# Patient Record
Sex: Female | Born: 1975 | Race: White | Hispanic: No | Marital: Single | Smoking: Current every day smoker
Health system: Southern US, Community
[De-identification: ages and names within clinical notes are randomized; demographics above are authoritative.]

## PROBLEM LIST (undated history)

## (undated) DIAGNOSIS — F32A Depression, unspecified: Secondary | ICD-10-CM

## (undated) DIAGNOSIS — J42 Unspecified chronic bronchitis: Secondary | ICD-10-CM

## (undated) DIAGNOSIS — M1990 Unspecified osteoarthritis, unspecified site: Secondary | ICD-10-CM

## (undated) DIAGNOSIS — T7840XA Allergy, unspecified, initial encounter: Secondary | ICD-10-CM

## (undated) DIAGNOSIS — K219 Gastro-esophageal reflux disease without esophagitis: Secondary | ICD-10-CM

## (undated) HISTORY — DX: Allergy, unspecified, initial encounter: T78.40XA

## (undated) HISTORY — DX: Unspecified osteoarthritis, unspecified site: M19.90

## (undated) HISTORY — PX: CARDIAC SURGERY: SHX584

## (undated) HISTORY — DX: Depression, unspecified: F32.A

## (undated) HISTORY — DX: Gastro-esophageal reflux disease without esophagitis: K21.9

## (undated) HISTORY — PX: CARPAL TUNNEL RELEASE: SHX101

---

## 2017-08-22 DIAGNOSIS — E539 Vitamin B deficiency, unspecified: Secondary | ICD-10-CM | POA: Insufficient documentation

## 2018-12-22 DIAGNOSIS — J209 Acute bronchitis, unspecified: Secondary | ICD-10-CM | POA: Insufficient documentation

## 2018-12-22 DIAGNOSIS — Z8249 Family history of ischemic heart disease and other diseases of the circulatory system: Secondary | ICD-10-CM | POA: Insufficient documentation

## 2018-12-22 DIAGNOSIS — R0789 Other chest pain: Secondary | ICD-10-CM | POA: Insufficient documentation

## 2019-07-01 ENCOUNTER — Emergency Department
Admission: EM | Admit: 2019-07-01 | Discharge: 2019-07-01 | Disposition: A | Discharge status: Home / Self Care | Payer: Self-pay | Attending: Emergency Medicine | Admitting: Emergency Medicine

## 2019-07-01 ENCOUNTER — Encounter: Payer: Self-pay | Admitting: Emergency Medicine

## 2019-07-01 ENCOUNTER — Emergency Department: Payer: Self-pay

## 2019-07-01 ENCOUNTER — Other Ambulatory Visit: Payer: Self-pay

## 2019-07-01 DIAGNOSIS — F17210 Nicotine dependence, cigarettes, uncomplicated: Secondary | ICD-10-CM | POA: Insufficient documentation

## 2019-07-01 DIAGNOSIS — J020 Streptococcal pharyngitis: Secondary | ICD-10-CM | POA: Insufficient documentation

## 2019-07-01 DIAGNOSIS — N3000 Acute cystitis without hematuria: Secondary | ICD-10-CM | POA: Insufficient documentation

## 2019-07-01 DIAGNOSIS — A388 Scarlet fever with other complications: Secondary | ICD-10-CM

## 2019-07-01 HISTORY — DX: Unspecified chronic bronchitis: J42

## 2019-07-01 LAB — CBC
HCT: 42 % (ref 36.0–46.0)
Hemoglobin: 15.5 g/dL — ABNORMAL HIGH (ref 12.0–15.0)
MCH: 36.9 pg — ABNORMAL HIGH (ref 26.0–34.0)
MCHC: 36.9 g/dL — ABNORMAL HIGH (ref 30.0–36.0)
MCV: 100 fL (ref 80.0–100.0)
Platelets: 219 10*3/uL (ref 150–400)
RBC: 4.2 MIL/uL (ref 3.87–5.11)
RDW: 12.9 % (ref 11.5–15.5)
WBC: 8.9 10*3/uL (ref 4.0–10.5)
nRBC: 0 % (ref 0.0–0.2)

## 2019-07-01 LAB — BASIC METABOLIC PANEL
Anion gap: 8 (ref 5–15)
BUN: 12 mg/dL (ref 6–20)
CO2: 23 mmol/L (ref 22–32)
Calcium: 9.4 mg/dL (ref 8.9–10.3)
Chloride: 106 mmol/L (ref 98–111)
Creatinine, Ser: 0.54 mg/dL (ref 0.44–1.00)
GFR calc Af Amer: >60 mL/min (ref >60)
GFR calc non Af Amer: >60 mL/min (ref >60)
Glucose, Bld: 103 mg/dL — ABNORMAL HIGH (ref 70–99)
Potassium: 3.9 mmol/L (ref 3.5–5.1)
Sodium: 137 mmol/L (ref 135–145)

## 2019-07-01 LAB — URINALYSIS, COMPLETE (UACMP) WITH MICROSCOPIC
Bacteria, UA: NONE SEEN
Bilirubin Urine: NEGATIVE
Glucose, UA: NEGATIVE mg/dL
Hgb urine dipstick: NEGATIVE
Ketones, ur: NEGATIVE mg/dL
Nitrite: NEGATIVE
Protein, ur: NEGATIVE mg/dL
Specific Gravity, Urine: 1.018 (ref 1.005–1.030)
pH: 7 (ref 5.0–8.0)

## 2019-07-01 LAB — POCT PREGNANCY, URINE: Preg Test, Ur: NEGATIVE

## 2019-07-01 LAB — TROPONIN I (HIGH SENSITIVITY): Troponin I (High Sensitivity): <2 ng/L (ref <18)

## 2019-07-01 MED ORDER — LIDOCAINE VISCOUS HCL 2 % MT SOLN
15.0000 mL | OROMUCOSAL | 0 refills | Status: DC | PRN
Start: 2019-07-01 — End: 2019-08-31

## 2019-07-01 MED ORDER — PREDNISONE 10 MG (21) PO TBPK
ORAL_TABLET | ORAL | 0 refills | Status: DC
Start: 2019-07-03 — End: 2019-08-31

## 2019-07-01 MED ORDER — DEXAMETHASONE SODIUM PHOSPHATE 10 MG/ML IJ SOLN
10.0000 mg | Freq: Once | INTRAMUSCULAR | Status: AC
  Administered 2019-07-01: 10 mg via INTRAMUSCULAR
  Filled 2019-07-01: qty 1

## 2019-07-01 MED ORDER — FLUCONAZOLE 150 MG PO TABS: 150.0000 mg | ORAL_TABLET | Freq: Every day | ORAL | 0 refills | Status: DC

## 2019-07-01 MED ORDER — CEPHALEXIN 500 MG PO CAPS
500.0000 mg | ORAL_CAPSULE | Freq: Two times a day (BID) | ORAL | 0 refills | Status: AC
Start: 2019-07-01 — End: 2019-07-11

## 2019-07-01 NOTE — Discharge Instructions (Signed)
Please follow-up with the primary care provider of your choice for symptoms that are not improving with medications.  Return to the emergency department for symptoms of change or worsen if you are unable to schedule an appointment.

## 2019-07-01 NOTE — ED Notes (Signed)
Pt states she feels the rash is getting worse. Pt denies any CP at this time. Pt states it is more of back pain from the rash. Pt states it hurts to swallow.  Pt denies any trouble breathing or SOB. Pt talking in complete sentences.

## 2019-07-01 NOTE — ED Triage Notes (Addendum)
Patient to ER for c/o rash that began 2 days ago. States she has developed a tightness feeling in her chest today. Rash is red, raised, and itchy. Patient states she also has a couple bruises present since rashes started (one on inner right thigh and one on inner right arm/forearm). Patient denies any recent known illnesses. +HA. Reports decreased urination with dark urine production.

## 2019-07-01 NOTE — ED Notes (Signed)
See triage note states she developed some itching couple of days ago  Then developed fine rash to abd,back and legs  Also states she feel like it is in her mouth

## 2019-07-02 NOTE — ED Provider Notes (Signed)
[facility] Emergency Department Provider Note ____________________________________________   First MD Initiated Contact with Patient 07/01/19 1715     (approximate)  I have reviewed the triage vital signs and the nursing notes.   HISTORY  Chief Complaint Rash and Chest Pain  HPI Katelyn Haynes is a 44 y.o. female presenting to the emergency department for treatment and evaluation of rash that began a couple of days ago.  Rash is on her abdomen, chest, lower extremities.  She is also complaining of sore throat and states that it "feels like strep throat."  She has had a headache as well as some chest "tightness."  She has used multiple over-the-counter creams to try and control the rash.  She reports symptoms have worsened and she became concerned when her sore throat got worse.         Past Medical History:  Diagnosis Date  . Chronic bronchitis (HCC)     There are no problems to display for this patient.   Past Surgical History:  Procedure Laterality Date  . CARPAL TUNNEL RELEASE Right   . CESAREAN SECTION     x4    Prior to Admission medications   Medication Sig Start Date End Date Taking? Authorizing Provider  cephALEXin (KEFLEX) 500 MG capsule Take 1 capsule (500 mg total) by mouth 2 (two) times daily for 10 days. 07/01/19 07/11/19  Siobahn Worsley, Rulon Eisenmenger B, FNP  fluconazole (DIFLUCAN) 150 MG tablet Take 1 tablet (150 mg total) by mouth daily. 07/01/19   Olliver Boyadjian B, FNP  lidocaine (XYLOCAINE) 2 % solution Use as directed 15 mLs in the mouth or throat as needed for mouth pain. 07/01/19   Jamaica Inthavong B, FNP  predniSONE (STERAPRED UNI-PAK 21 TAB) 10 MG (21) TBPK tablet Take 6 tablets on the first day and decrease by 1 tablet each day until finished. 07/03/19   Tomoki Lucken, Rulon Eisenmenger B, FNP    Allergies Bactrim [sulfamethoxazole-trimethoprim], Bee venom, Compazine [prochlorperazine], Contrast media [iodinated diagnostic agents], and Seroquel [quetiapine]  No  family history on file.  Social History Social History   Tobacco Use  . Smoking status: Current Every Day Smoker  . Smokeless tobacco: Never Used  Substance Use Topics  . Alcohol use: Yes  . Drug use: Not on file    Review of Systems  Constitutional: No fever/chills Eyes: No visual changes. ENT: Positive for sore throat. Cardiovascular: Denies chest pain. Respiratory: Denies shortness of breath. Gastrointestinal: No abdominal pain.  No nausea, no vomiting.  No diarrhea.  No constipation. Genitourinary: Negative for dysuria.  Positive for darker than normal urine Musculoskeletal: Negative for back pain. Skin: Positive for rash. Neurological: Negative for headaches, focal weakness or numbness. ____________________________________________   PHYSICAL EXAM:  VITAL SIGNS: ED Triage Vitals  Enc Vitals Group     BP 07/01/19 1442 (!) 122/50     Pulse Rate 07/01/19 1442 89     Resp 07/01/19 1442 20     Temp 07/01/19 1442 98 F (36.7 C)     Temp Source 07/01/19 1442 Oral     SpO2 07/01/19 1442 97 %     Weight 07/01/19 1447 186 lb (84.4 kg)     Height 07/01/19 1447 5' (1.524 m)     Head Circumference --      Peak Flow --      Pain Score 07/01/19 1443 7     Pain Loc --      Pain Edu? --      Excl.  in GC? --     Constitutional: Alert and oriented. Well appearing and in no acute distress. Eyes: Conjunctivae are normal. PERRL. EOMI. Head: Atraumatic. Nose: No congestion/rhinnorhea. Mouth/Throat: Mucous membranes are moist.  Oropharynx erythematous with enlarged tonsils with exudate. Neck: No stridor.   Hematological/Lymphatic/Immunilogical: No cervical lymphadenopathy. Cardiovascular: Normal rate, regular rhythm. Grossly normal heart sounds.  Good peripheral circulation. Respiratory: Normal respiratory effort.  No retractions. Lungs CTAB. Gastrointestinal: Soft and nontender. No distention. No abdominal bruits. No CVA tenderness. Genitourinary:  Musculoskeletal: No lower  extremity tenderness nor edema.  No joint effusions. Neurologic:  Normal speech and language. No gross focal neurologic deficits are appreciated. No gait instability. Skin:  Skin is warm, dry and intact.  Scarlatiniform type rash noted over abdomen and chest. Psychiatric: Mood and affect are normal. Speech and behavior are normal.  ____________________________________________   LABS (all labs ordered are listed, but only abnormal results are displayed)  Labs Reviewed  BASIC METABOLIC PANEL - Abnormal; Notable for the following components:      Result Value   Glucose, Bld 103 (*)    All other components within normal limits  CBC - Abnormal; Notable for the following components:   Hemoglobin 15.5 (*)    MCH 36.9 (*)    MCHC 36.9 (*)    All other components within normal limits  URINALYSIS, COMPLETE (UACMP) WITH MICROSCOPIC - Abnormal; Notable for the following components:   Color, Urine YELLOW (*)    APPearance HAZY (*)    Leukocytes,Ua MODERATE (*)    All other components within normal limits  POC URINE PREG, ED  POCT PREGNANCY, URINE  TROPONIN I (HIGH SENSITIVITY)  TROPONIN I (HIGH SENSITIVITY)   ____________________________________________  EKG  Not indicated ____________________________________________  RADIOLOGY  ED MD interpretation:     Official radiology report(s): DG Chest 2 View  Result Date: 07/01/2019 CLINICAL DATA:  Chest tightness EXAM: CHEST - 2 VIEW COMPARISON:  07/17/2018 FINDINGS: The heart size and mediastinal contours are within normal limits. Both lungs are clear. The visualized skeletal structures are unremarkable. IMPRESSION: No active cardiopulmonary disease. Electronically Signed   By: Jasmine Pang M.D.   On: 07/01/2019 15:20    ____________________________________________   PROCEDURES  Procedure(s) performed (including Critical Care):  Procedures  ____________________________________________   INITIAL IMPRESSION / ASSESSMENT AND PLAN      44 year old female presenting to emergency department for treatment and evaluation of rash, sore throat, chest tightness, and darker than normal urine with mild dysuria.  See HPI for further details.  DIFFERENTIAL DIAGNOSIS  Strep pharyngitis with scarlatiniform rash, glomerulonephritis, acute cystitis, contact dermatitis, allergic reaction  ED COURSE  Rashes consistent with a scarlatiniform secondary to strep throat.  Urinalysis is consistent with acute cystitis.  She will be treated with 10 days of Keflex, viscous lidocaine for the sore throat, and prednisone.  Patient requested injection of steroid while here in the emergency department.  She was advised not to take the prednisone taper unless symptoms are not improving.  She is to wait 2 days before starting the Pred pack.  She is to follow-up with a primary care provider for choice for symptoms that are not improving over the next couple days.  She is to return to the emergency department for any symptom that changes or worsens if she is unable to schedule appointment. ____________________________________________   FINAL CLINICAL IMPRESSION(S) / ED DIAGNOSES  Final diagnoses:  Streptococcal sore throat with scarlatina  Acute cystitis without hematuria     ED Discharge  Orders         Ordered    predniSONE (STERAPRED UNI-PAK 21 TAB) 10 MG (21) TBPK tablet     07/01/19 1816    cephALEXin (KEFLEX) 500 MG capsule  2 times daily     07/01/19 1816    fluconazole (DIFLUCAN) 150 MG tablet  Daily     07/01/19 1816    lidocaine (XYLOCAINE) 2 % solution  As needed     07/01/19 1816           Katelyn Haynes was evaluated in Emergency Department on 07/02/2019 for the symptoms described in the history of present illness. She was evaluated in the context of the global COVID-19 pandemic, which necessitated consideration that the patient might be at risk for infection with the SARS-CoV-2 virus that causes COVID-19. Institutional protocols  and algorithms that pertain to the evaluation of patients at risk for COVID-19 are in a state of rapid change based on information released by regulatory bodies including the CDC and federal and state organizations. These policies and algorithms were followed during the patient's care in the ED.   Note:  This document was prepared using Dragon voice recognition software and may include unintentional dictation errors.   Victorino Dike, FNP 07/02/19 0008    Earleen Newport, MD 07/02/19 1459

## 2019-08-31 ENCOUNTER — Encounter: Payer: Self-pay | Admitting: Emergency Medicine

## 2019-08-31 ENCOUNTER — Emergency Department
Admission: EM | Admit: 2019-08-31 | Discharge: 2019-08-31 | Disposition: A | Discharge status: Home / Self Care | Payer: Medicaid Other | Attending: Emergency Medicine | Admitting: Emergency Medicine

## 2019-08-31 ENCOUNTER — Other Ambulatory Visit: Payer: Self-pay

## 2019-08-31 ENCOUNTER — Emergency Department: Payer: Medicaid Other

## 2019-08-31 DIAGNOSIS — Z91030 Bee allergy status: Secondary | ICD-10-CM | POA: Insufficient documentation

## 2019-08-31 DIAGNOSIS — J42 Unspecified chronic bronchitis: Secondary | ICD-10-CM | POA: Insufficient documentation

## 2019-08-31 DIAGNOSIS — M5412 Radiculopathy, cervical region: Secondary | ICD-10-CM | POA: Insufficient documentation

## 2019-08-31 DIAGNOSIS — Z79899 Other long term (current) drug therapy: Secondary | ICD-10-CM | POA: Insufficient documentation

## 2019-08-31 DIAGNOSIS — F17200 Nicotine dependence, unspecified, uncomplicated: Secondary | ICD-10-CM | POA: Insufficient documentation

## 2019-08-31 MED ORDER — PREDNISONE 20 MG PO TABS
60.0000 mg | ORAL_TABLET | Freq: Once | ORAL | Status: AC
  Administered 2019-08-31: 60 mg via ORAL
  Filled 2019-08-31: qty 3

## 2019-08-31 MED ORDER — TRAMADOL HCL 50 MG PO TABS: 50.0000 mg | ORAL_TABLET | Freq: Four times a day (QID) | ORAL | 0 refills | Status: DC | PRN

## 2019-08-31 MED ORDER — CYCLOBENZAPRINE HCL 10 MG PO TABS: 10.0000 mg | ORAL_TABLET | Freq: Three times a day (TID) | ORAL | 0 refills | Status: DC | PRN

## 2019-08-31 MED ORDER — METHYLPREDNISOLONE 4 MG PO TBPK
ORAL_TABLET | ORAL | 0 refills | Status: DC
Start: 2019-08-31 — End: 2021-12-12

## 2019-08-31 MED ORDER — TRAMADOL HCL 50 MG PO TABS
50.0000 mg | ORAL_TABLET | Freq: Once | ORAL | Status: AC
  Administered 2019-08-31: 50 mg via ORAL
  Filled 2019-08-31: qty 1

## 2019-08-31 MED ORDER — CYCLOBENZAPRINE HCL 10 MG PO TABS
10.0000 mg | ORAL_TABLET | Freq: Once | ORAL | Status: AC
  Administered 2019-08-31: 10 mg via ORAL
  Filled 2019-08-31: qty 1

## 2019-08-31 NOTE — ED Triage Notes (Signed)
Pt reports pain to her right shoulder that started yesterday. Pt states the pain shoots down to her elbow. Pt denies obvious injuries.

## 2019-08-31 NOTE — ED Notes (Signed)
See triage note  Presents with pain to right shoulder   States pain is from neck into posterior shoulder  Denies any injury   Pain increased with movement

## 2019-08-31 NOTE — ED Provider Notes (Signed)
[facility] Emergency Department Provider Note   ____________________________________________   First MD Initiated Contact with Patient 08/31/19 1042     (approximate)  I have reviewed the triage vital signs and the nursing notes.   HISTORY  Chief Complaint Shoulder Pain    HPI Katelyn Haynes is a 44 y.o. female patient presents with radicular pain to the right upper extremity from her neck.  Onset of complaint yesterday.  No provocative incident for complaint.  Patient rates pain radiates all the way to her index finger.      Past Medical History:  Diagnosis Date  . Chronic bronchitis (HCC)     There are no problems to display for this patient.   Past Surgical History:  Procedure Laterality Date  . CARPAL TUNNEL RELEASE Right   . CESAREAN SECTION     x4    Prior to Admission medications   Medication Sig Start Date End Date Taking? Authorizing Provider  atorvastatin (LIPITOR) 10 MG tablet Take 10 mg by mouth daily.   Yes [provider]  citalopram (CELEXA) 20 MG tablet Take 20 mg by mouth daily.   Yes [provider]  isosorbide dinitrate (ISORDIL) 10 MG tablet Take 10 mg by mouth 2 (two) times daily.   Yes [provider]  cyclobenzaprine (FLEXERIL) 10 MG tablet Take 1 tablet (10 mg total) by mouth 3 (three) times daily as needed. 08/31/19   Joni Reining, PA-C  methylPREDNISolone (MEDROL DOSEPAK) 4 MG TBPK tablet Take Tapered dose as directed 08/31/19   Joni Reining, PA-C  traMADol (ULTRAM) 50 MG tablet Take 1 tablet (50 mg total) by mouth every 6 (six) hours as needed for moderate pain. 08/31/19   Joni Reining, PA-C    Allergies Bactrim [sulfamethoxazole-trimethoprim], Bee venom, Compazine [prochlorperazine], Contrast media [iodinated diagnostic agents], and Seroquel [quetiapine]  No family history on file.  Social History Social History   Tobacco Use  . Smoking status: Current Every Day Smoker  .  Smokeless tobacco: Never Used  Substance Use Topics  . Alcohol use: Yes  . Drug use: Not on file    Review of Systems Constitutional: No fever/chills Eyes: No visual changes. ENT: No sore throat. Cardiovascular: Denies chest pain. Respiratory: Denies shortness of breath. Gastrointestinal: No abdominal pain.  No nausea, no vomiting.  No diarrhea.  No constipation. Genitourinary: Negative for dysuria. Musculoskeletal: Negative for back pain. Skin: Negative for rash. Neurological: Negative for headaches, focal weakness or numbness. Psychiatric:  Depression Endocrine:  Hyperlipidemia Allergic/Immunilogical: Bactrim, bee stings, Compazine, contrast media, and Seroquel ____________________________________________   PHYSICAL EXAM:  VITAL SIGNS: ED Triage Vitals  Enc Vitals Group     BP 08/31/19 1043 114/67     Pulse Rate 08/31/19 1043 83     Resp 08/31/19 1043 20     Temp 08/31/19 1043 98.2 F (36.8 C)     Temp Source 08/31/19 1043 Oral     SpO2 08/31/19 1043 100 %     Weight 08/31/19 1040 186 lb (84.4 kg)     Height 08/31/19 1040 5' (1.524 m)     Head Circumference --      Peak Flow --      Pain Score 08/31/19 1040 8     Pain Loc --      Pain Edu? --      Excl. in GC? --    Constitutional: Alert and oriented. Well appearing and in no acute distress. Neck:   No  cervical spine tenderness to palpation. Hematological/Lymphatic/Immunilogical: No cervical lymphadenopathy. Cardiovascular: Normal rate, regular rhythm. Grossly normal heart sounds.  Good peripheral circulation. Respiratory: Normal respiratory effort.  No retractions. Lungs CTAB. Gastrointestinal: Soft and nontender. No distention. No abdominal bruits. No CVA tenderness. Genitourinary: Deferred Musculoskeletal: No obvious deformity to the right upper extremity.  Patient is moderate guarding palpation of GH joint.  Decreased range of motion with adduction.  Neurologic:  Normal speech and language. No gross focal  neurologic deficits are appreciated. No gait instability. Skin:  Skin is warm, dry and intact. No rash noted. Psychiatric: Mood and affect are normal. Speech and behavior are normal.  ____________________________________________   LABS (all labs ordered are listed, but only abnormal results are displayed)  Labs Reviewed - No data to display ____________________________________________  EKG   ____________________________________________  RADIOLOGY  ED MD interpretation:    Official radiology report(s): DG Cervical Spine 2-3 Views  Result Date: 08/31/2019 CLINICAL DATA:  Radicular pain right upper extremity. EXAM: CERVICAL SPINE - 2-3 VIEW COMPARISON:  07/01/2019. FINDINGS: Loss of normal cervical lordosis. Mild disc degeneration C5-C6 with mild endplate osteophyte formation. No acute bony abnormality identified. No evidence of fracture. IMPRESSION: Mild degenerative changes C5-C6.  No acute abnormality. Electronically Signed   By: Maisie Fus  Register   On: 08/31/2019 12:05   DG Shoulder Right  Result Date: 08/31/2019 CLINICAL DATA:  Right shoulder pain EXAM: RIGHT SHOULDER - 2+ VIEW COMPARISON:  None. FINDINGS: There is no evidence of fracture or dislocation. There is no evidence of arthropathy or other focal bone abnormality. Soft tissues are unremarkable. IMPRESSION: Negative. Electronically Signed   By: Duanne Guess D.O.   On: 08/31/2019 12:05    ____________________________________________   PROCEDURES  Procedure(s) performed (including Critical Care):  Procedures   ____________________________________________   INITIAL IMPRESSION / ASSESSMENT AND PLAN / ED COURSE  As part of my medical decision making, I reviewed the following data within the electronic MEDICAL RECORD NUMBER     Patient presents with atraumatic right shoulder pain that radiates down to her index finger.  Discussed x-ray findings with patient showing loss of lordosis of the cervical spine along with  degenerative disc disease.  Patient complaining physical exam consistent with cervical radiculopathy.  Patient placed in arm sling and given discharge care instruction.  Patient advised follow-up PCP for continued care.   Katelyn Haynes was evaluated in Emergency Department on 08/31/2019 for the symptoms described in the history of present illness. She was evaluated in the context of the global COVID-19 pandemic, which necessitated consideration that the patient might be at risk for infection with the SARS-CoV-2 virus that causes COVID-19. Institutional protocols and algorithms that pertain to the evaluation of patients at risk for COVID-19 are in a state of rapid change based on information released by regulatory bodies including the CDC and federal and state organizations. These policies and algorithms were followed during the patient's care in the ED.       ____________________________________________   FINAL CLINICAL IMPRESSION(S) / ED DIAGNOSES  Final diagnoses:  Cervical radiculopathy     ED Discharge Orders         Ordered    methylPREDNISolone (MEDROL DOSEPAK) 4 MG TBPK tablet     Discontinue  Reprint     08/31/19 1227    cyclobenzaprine (FLEXERIL) 10 MG tablet  3 times daily PRN     Discontinue  Reprint     08/31/19 1227    traMADol (ULTRAM) 50 MG tablet  Every  6 hours PRN     Discontinue  Reprint     08/31/19 1227           Note:  This document was prepared using Dragon voice recognition software and may include unintentional dictation errors.    Joni Reining, PA-C 08/31/19 1229    Sharman Cheek, MD 08/31/19 1459

## 2020-01-10 ENCOUNTER — Other Ambulatory Visit (HOSPITAL_COMMUNITY): Payer: Self-pay | Admitting: Family

## 2020-01-10 DIAGNOSIS — U071 COVID-19: Secondary | ICD-10-CM

## 2020-01-10 NOTE — Progress Notes (Signed)
I connected by phone with Katelyn Haynes on 01/10/2020 at 7:42 PM to discuss the potential use of a new treatment for mild to moderate COVID-19 viral infection in non-hospitalized patients.  This patient is a 43 y.o. female that meets the FDA criteria for Emergency Use Authorization of COVID monoclonal antibody casirivimab/imdevimab, bamlanivimab/eteseviamb, or sotrovimab.  Has a (+) direct SARS-CoV-2 viral test result  Has mild or moderate COVID-19   Is NOT hospitalized due to COVID-19  Is within 10 days of symptom onset  Has at least one of the high risk factor(s) for progression to severe COVID-19 and/or hospitalization as defined in EUA.  Specific high risk criteria : BMI > 25 and Chronic Lung Disease   Symptoms of H/A, SOB, chest tightness began 01/06/20.   I have spoken and communicated the following to the patient or parent/caregiver regarding COVID monoclonal antibody treatment:  1. FDA has authorized the emergency use for the treatment of mild to moderate COVID-19 in adults and pediatric patients with positive results of direct SARS-CoV-2 viral testing who are 64 years of age and older weighing at least 40 kg, and who are at high risk for progressing to severe COVID-19 and/or hospitalization.  2. The significant known and potential risks and benefits of COVID monoclonal antibody, and the extent to which such potential risks and benefits are unknown.  3. Information on available alternative treatments and the risks and benefits of those alternatives, including clinical trials.  4. Patients treated with COVID monoclonal antibody should continue to self-isolate and use infection control measures (e.g., wear mask, isolate, social distance, avoid sharing personal items, clean and disinfect "high touch" surfaces, and frequent handwashing) according to CDC guidelines.   5. The patient or parent/caregiver has the option to accept or refuse COVID monoclonal antibody treatment.  After  reviewing this information with the patient, the patient has agreed to receive one of the available covid 19 monoclonal antibodies and will be provided an appropriate fact sheet prior to infusion. Morton Stall, NP 01/10/2020 7:42 PM

## 2020-01-11 ENCOUNTER — Ambulatory Visit (HOSPITAL_COMMUNITY)
Admission: RE | Admit: 2020-01-11 | Discharge: 2020-01-11 | Disposition: A | Discharge status: Home / Self Care | Payer: Medicaid Other | Source: Ambulatory Visit | Attending: Pulmonary Disease | Admitting: Pulmonary Disease

## 2020-01-11 ENCOUNTER — Other Ambulatory Visit (HOSPITAL_COMMUNITY): Payer: Self-pay

## 2020-01-11 DIAGNOSIS — U071 COVID-19: Secondary | ICD-10-CM | POA: Insufficient documentation

## 2020-01-11 MED ORDER — ALBUTEROL SULFATE HFA 108 (90 BASE) MCG/ACT IN AERS: 2.0000 | INHALATION_SPRAY | Freq: Once | RESPIRATORY_TRACT | Status: DC | PRN

## 2020-01-11 MED ORDER — DIPHENHYDRAMINE HCL 50 MG/ML IJ SOLN: 50.0000 mg | Freq: Once | INTRAMUSCULAR | Status: DC | PRN

## 2020-01-11 MED ORDER — FAMOTIDINE IN NACL 20-0.9 MG/50ML-% IV SOLN: 20.0000 mg | Freq: Once | INTRAVENOUS | Status: DC | PRN

## 2020-01-11 MED ORDER — METHYLPREDNISOLONE SODIUM SUCC 125 MG IJ SOLR: 125.0000 mg | Freq: Once | INTRAMUSCULAR | Status: DC | PRN

## 2020-01-11 MED ORDER — SODIUM CHLORIDE 0.9 % IV SOLN: INTRAVENOUS | Status: DC | PRN

## 2020-01-11 MED ORDER — EPINEPHRINE 0.3 MG/0.3ML IJ SOAJ: 0.3000 mg | Freq: Once | INTRAMUSCULAR | Status: DC | PRN

## 2020-01-11 MED ORDER — SOTROVIMAB 500 MG/8ML IV SOLN
500.0000 mg | Freq: Once | INTRAVENOUS | Status: AC
  Administered 2020-01-11: 500 mg via INTRAVENOUS

## 2020-01-11 NOTE — Discharge Instructions (Signed)

## 2020-01-11 NOTE — Progress Notes (Signed)
Diagnosis: COVID-19  Physician: Dr. Patrick Wright  Procedure: [facility] Med: Sotrovimab infusion - Provided patient with sotrovimab fact sheet for patients, parents, and caregivers prior to infusion.   Complications: No immediate complications noted  Discharge: Discharged home    

## 2020-03-02 DIAGNOSIS — D519 Vitamin B12 deficiency anemia, unspecified: Secondary | ICD-10-CM | POA: Diagnosis not present

## 2020-03-02 DIAGNOSIS — H6091 Unspecified otitis externa, right ear: Secondary | ICD-10-CM | POA: Diagnosis not present

## 2020-03-02 DIAGNOSIS — Z309 Encounter for contraceptive management, unspecified: Secondary | ICD-10-CM | POA: Diagnosis not present

## 2020-08-14 ENCOUNTER — Emergency Department
Admission: EM | Admit: 2020-08-14 | Discharge: 2020-08-14 | Disposition: A | Discharge status: Home / Self Care | Payer: Medicaid Other | Attending: Emergency Medicine | Admitting: Emergency Medicine

## 2020-08-14 ENCOUNTER — Encounter: Payer: Self-pay | Admitting: Emergency Medicine

## 2020-08-14 ENCOUNTER — Other Ambulatory Visit: Payer: Self-pay

## 2020-08-14 DIAGNOSIS — F17200 Nicotine dependence, unspecified, uncomplicated: Secondary | ICD-10-CM | POA: Insufficient documentation

## 2020-08-14 DIAGNOSIS — M62838 Other muscle spasm: Secondary | ICD-10-CM | POA: Insufficient documentation

## 2020-08-14 DIAGNOSIS — M546 Pain in thoracic spine: Secondary | ICD-10-CM

## 2020-08-14 MED ORDER — NAPROXEN 500 MG PO TABS: 500.0000 mg | ORAL_TABLET | Freq: Two times a day (BID) | ORAL | 2 refills | Status: DC

## 2020-08-14 MED ORDER — METHOCARBAMOL 500 MG PO TABS: 500.0000 mg | ORAL_TABLET | Freq: Three times a day (TID) | ORAL | 0 refills | Status: DC

## 2020-08-14 NOTE — ED Provider Notes (Signed)
[facility] Emergency Department Provider Note   ____________________________________________    I have reviewed the triage vital signs and the nursing notes.   HISTORY  Chief Complaint Back pain     HPI Katelyn Haynes is a 45 y.o. female who presents with complaints of right-sided mid back pain which is been ongoing for 2 to 3 days.  She reports it is worse when she twists at the torso.  Denies shortness of breath cough fevers or chills.  No chest pain.  Has taken ibuprofen with some improvement.  Past Medical History:  Diagnosis Date   Chronic bronchitis (HCC)     There are no problems to display for this patient.   Past Surgical History:  Procedure Laterality Date   CARDIAC SURGERY     cardiac catheterization   CARPAL TUNNEL RELEASE Right    CESAREAN SECTION     x4    Prior to Admission medications   Medication Sig Start Date End Date Taking? Authorizing Provider  methocarbamol (ROBAXIN) 500 MG tablet Take 1 tablet (500 mg total) by mouth 3 (three) times daily. 08/14/20  Yes Jene Every, MD  naproxen (NAPROSYN) 500 MG tablet Take 1 tablet (500 mg total) by mouth 2 (two) times daily with a meal. 08/14/20  Yes Jene Every, MD  atorvastatin (LIPITOR) 10 MG tablet Take 10 mg by mouth daily.    [provider]  citalopram (CELEXA) 20 MG tablet Take 20 mg by mouth daily.    [provider]  cyclobenzaprine (FLEXERIL) 10 MG tablet Take 1 tablet (10 mg total) by mouth 3 (three) times daily as needed. 08/31/19   Joni Reining, PA-C  isosorbide dinitrate (ISORDIL) 10 MG tablet Take 10 mg by mouth 2 (two) times daily.    [provider]  methylPREDNISolone (MEDROL DOSEPAK) 4 MG TBPK tablet Take Tapered dose as directed 08/31/19   Joni Reining, PA-C  traMADol (ULTRAM) 50 MG tablet Take 1 tablet (50 mg total) by mouth every 6 (six) hours as needed for moderate pain. 08/31/19   Joni Reining, PA-C     Allergies Bactrim  [sulfamethoxazole-trimethoprim], Bee venom, Compazine [prochlorperazine], Contrast media [iodinated diagnostic agents], and Seroquel [quetiapine]  No family history on file.  Social History Social History   Tobacco Use   Smoking status: Every Day    Pack years: 0.00   Smokeless tobacco: Never  Substance Use Topics   Alcohol use: Yes    Review of Systems  Constitutional: No fever/chills     Gastrointestinal: No abdominal pain.  No nausea, no vomiting.    Musculoskeletal: As above Skin: Negative for rash. Neurological: Negative for numbness or tingling    ____________________________________________   PHYSICAL EXAM:  VITAL SIGNS: ED Triage Vitals  Enc Vitals Group     BP 08/14/20 1139 129/75     Pulse Rate 08/14/20 1139 71     Resp 08/14/20 1139 16     Temp 08/14/20 1139 98.6 F (37 C)     Temp Source 08/14/20 1139 Oral     SpO2 08/14/20 1139 99 %     Weight 08/14/20 1140 84.4 kg (186 lb 1.1 oz)     Height 08/14/20 1140 1.524 m (5')     Head Circumference --      Peak Flow --      Pain Score 08/14/20 1139 7     Pain Loc --      Pain Edu? --  Excl. in GC? --      Constitutional: Alert and oriented. No acute distress. Pleasant and interactive Eyes: Conjunctivae are normal.  Head: Atraumatic. Nose: No congestion/rhinnorhea. Mouth/Throat: Mucous membranes are moist.   Cardiovascular: Normal rate, regular rhythm.  Respiratory: Normal respiratory effort.  No retractions. Genitourinary: deferred Musculoskeletal: Back: No vertebral tenderness palpation.  Right paraspinal point tenderness just inferior to the scapula replicates her pain exactly.  No rash or abnormality. Neurologic:  Normal speech and language. No gross focal neurologic deficits are appreciated.   Skin:  Skin is warm, dry and intact. No rash noted.   ____________________________________________   LABS (all labs ordered are listed, but only abnormal results are displayed)  Labs Reviewed  - No data to display ____________________________________________  EKG  ED ECG REPORT I, Jene Every, the attending physician, personally viewed and interpreted this ECG.  Date: 08/14/2020  Rhythm: normal sinus rhythm QRS Axis: normal Intervals: No acute abnormalities ST/T Wave abnormalities: normal Narrative Interpretation: no evidence of acute ischemia  ____________________________________________  RADIOLOGY   ____________________________________________   PROCEDURES  Procedure(s) performed: No  Procedures   Critical Care performed: No ____________________________________________   INITIAL IMPRESSION / ASSESSMENT AND PLAN / ED COURSE  Pertinent labs & imaging results that were available during my care of the patient were reviewed by me and considered in my medical decision making (see chart for details).   Exam is consistent with muscle spasm causing her back pain.  Muscle relaxer, NSAIDs Rx, outpatient follow-up.   ____________________________________________   FINAL CLINICAL IMPRESSION(S) / ED DIAGNOSES  Final diagnoses:  Muscle spasm  Acute right-sided thoracic back pain      NEW MEDICATIONS STARTED DURING THIS VISIT:  Discharge Medication List as of 08/14/2020 12:43 PM     START taking these medications   Details  methocarbamol (ROBAXIN) 500 MG tablet Take 1 tablet (500 mg total) by mouth 3 (three) times daily., Starting Mon 08/14/2020, Normal    naproxen (NAPROSYN) 500 MG tablet Take 1 tablet (500 mg total) by mouth 2 (two) times daily with a meal., Starting Mon 08/14/2020, Normal         Note:  This document was prepared using Dragon voice recognition software and may include unintentional dictation errors.    Jene Every, MD 08/14/20 [phone]

## 2020-08-14 NOTE — ED Triage Notes (Signed)
C/O right mid back cramping pain x 2 days.  States pain 'takes my breath away'  States pain worse with deep breath and when turning to the right.  AAOx3.  Skin warm and dry. NAD

## 2020-08-16 ENCOUNTER — Other Ambulatory Visit: Payer: Self-pay

## 2020-08-16 ENCOUNTER — Emergency Department: Payer: Medicaid Other

## 2020-08-16 ENCOUNTER — Emergency Department
Admission: EM | Admit: 2020-08-16 | Discharge: 2020-08-16 | Disposition: A | Discharge status: Home / Self Care | Payer: Medicaid Other | Attending: Emergency Medicine | Admitting: Emergency Medicine

## 2020-08-16 DIAGNOSIS — R069 Unspecified abnormalities of breathing: Secondary | ICD-10-CM | POA: Diagnosis not present

## 2020-08-16 DIAGNOSIS — R0602 Shortness of breath: Secondary | ICD-10-CM | POA: Diagnosis not present

## 2020-08-16 DIAGNOSIS — U071 COVID-19: Secondary | ICD-10-CM | POA: Insufficient documentation

## 2020-08-16 DIAGNOSIS — F17200 Nicotine dependence, unspecified, uncomplicated: Secondary | ICD-10-CM | POA: Insufficient documentation

## 2020-08-16 DIAGNOSIS — Z28311 Partially vaccinated for covid-19: Secondary | ICD-10-CM | POA: Insufficient documentation

## 2020-08-16 LAB — RESP PANEL BY RT-PCR (FLU A&B, COVID) ARPGX2
Influenza A by PCR: NEGATIVE
Influenza B by PCR: NEGATIVE
SARS Coronavirus 2 by RT PCR: POSITIVE — AB

## 2020-08-16 LAB — URINALYSIS, COMPLETE (UACMP) WITH MICROSCOPIC
Bilirubin Urine: NEGATIVE
Glucose, UA: NEGATIVE mg/dL
Hgb urine dipstick: NEGATIVE
Ketones, ur: 20 mg/dL — AB
Nitrite: NEGATIVE
Protein, ur: NEGATIVE mg/dL
Specific Gravity, Urine: 1.024 (ref 1.005–1.030)
pH: 5 (ref 5.0–8.0)

## 2020-08-16 LAB — CBC
HCT: 40.7 % (ref 36.0–46.0)
Hemoglobin: 14.8 g/dL (ref 12.0–15.0)
MCH: 37 pg — ABNORMAL HIGH (ref 26.0–34.0)
MCHC: 36.4 g/dL — ABNORMAL HIGH (ref 30.0–36.0)
MCV: 101.8 fL — ABNORMAL HIGH (ref 80.0–100.0)
Platelets: 199 10*3/uL (ref 150–400)
RBC: 4 MIL/uL (ref 3.87–5.11)
RDW: 11.9 % (ref 11.5–15.5)
WBC: 8 10*3/uL (ref 4.0–10.5)
nRBC: 0 % (ref 0.0–0.2)

## 2020-08-16 LAB — BASIC METABOLIC PANEL
Anion gap: 10 (ref 5–15)
BUN: 7 mg/dL (ref 6–20)
CO2: 22 mmol/L (ref 22–32)
Calcium: 9.3 mg/dL (ref 8.9–10.3)
Chloride: 104 mmol/L (ref 98–111)
Creatinine, Ser: 0.61 mg/dL (ref 0.44–1.00)
GFR, Estimated: >60 mL/min (ref >60)
Glucose, Bld: 105 mg/dL — ABNORMAL HIGH (ref 70–99)
Potassium: 3.7 mmol/L (ref 3.5–5.1)
Sodium: 136 mmol/L (ref 135–145)

## 2020-08-16 LAB — TROPONIN I (HIGH SENSITIVITY): Troponin I (High Sensitivity): <2 ng/L (ref <18)

## 2020-08-16 MED ORDER — NIRMATRELVIR/RITONAVIR (PAXLOVID)TABLET: 3.0000 | ORAL_TABLET | Freq: Two times a day (BID) | ORAL | 0 refills | Status: AC

## 2020-08-16 MED ORDER — FOSFOMYCIN TROMETHAMINE 3 G PO PACK
3.0000 g | PACK | Freq: Once | ORAL | Status: AC
  Administered 2020-08-16: 3 g via ORAL
  Filled 2020-08-16: qty 3

## 2020-08-16 MED ORDER — KETOROLAC TROMETHAMINE 30 MG/ML IJ SOLN
30.0000 mg | Freq: Once | INTRAMUSCULAR | Status: AC
  Administered 2020-08-16: 12:00:00 30 mg via INTRAVENOUS
  Filled 2020-08-16: qty 1

## 2020-08-16 MED ORDER — HYDROCOD POLST-CPM POLST ER 10-8 MG/5ML PO SUER
5.0000 mL | Freq: Once | ORAL | Status: AC
  Administered 2020-08-16: 5 mL via ORAL
  Filled 2020-08-16: qty 5

## 2020-08-16 MED ORDER — SODIUM CHLORIDE 0.9 % IV BOLUS
1000.0000 mL | Freq: Once | INTRAVENOUS | Status: AC
  Administered 2020-08-16: 12:00:00 1000 mL via INTRAVENOUS

## 2020-08-16 MED ORDER — ONDANSETRON 4 MG PO TBDP: 4.0000 mg | ORAL_TABLET | Freq: Three times a day (TID) | ORAL | 0 refills | Status: DC | PRN

## 2020-08-16 MED ORDER — GUAIFENESIN-CODEINE 100-10 MG/5ML PO SOLN
5.0000 mL | Freq: Four times a day (QID) | ORAL | 0 refills | Status: DC | PRN
Start: 2020-08-16 — End: 2021-12-12

## 2020-08-16 MED ORDER — ONDANSETRON HCL 4 MG/2ML IJ SOLN
4.0000 mg | Freq: Once | INTRAMUSCULAR | Status: AC
  Administered 2020-08-16: 12:00:00 4 mg via INTRAVENOUS
  Filled 2020-08-16: qty 2

## 2020-08-16 NOTE — ED Triage Notes (Signed)
Pt comes into the ED via [facility] from home c/o [facility] d/t being COVID positive.  Pt tested with home test.  [facility] started Monday and has gotten worse.  124/64, 97 HR NSR, 98% RA, CBG 117. Pt present with even and unlabored respirations.

## 2020-08-16 NOTE — ED Provider Notes (Signed)
[facility] Emergency Department Provider Note  Time seen: 11:34 AM  I have reviewed the triage vital signs and the nursing notes.   HISTORY  Chief Complaint Shortness of Breath and Chest Pain   HPI Katelyn Haynes is a 45 y.o. female with a past medical history of chronic bronchitis presents to the emergency department for cough shortness of breath chest pain and headache.  According to the patient since yesterday she has been coughing and feeling somewhat short of breath last night she developed a headache and subjective fever.  States she took Tylenol but continued to have a headache this morning as well as a cough so she took a COVID test that resulted positive for patient.  Patient states approximately 6 months ago she had COVID and received monoclonal antibody infusions.  Patient states she received 1 vaccine a Mealor time ago but did not receive any further vaccinations or boosters.   Past Medical History:  Diagnosis Date   Chronic bronchitis (HCC)     There are no problems to display for this patient.   Past Surgical History:  Procedure Laterality Date   CARDIAC SURGERY     cardiac catheterization   CARPAL TUNNEL RELEASE Right    CESAREAN SECTION     x4    Prior to Admission medications   Medication Sig Start Date End Date Taking? Authorizing Provider  atorvastatin (LIPITOR) 10 MG tablet Take 10 mg by mouth daily.    [provider]  citalopram (CELEXA) 20 MG tablet Take 20 mg by mouth daily.    [provider]  cyclobenzaprine (FLEXERIL) 10 MG tablet Take 1 tablet (10 mg total) by mouth 3 (three) times daily as needed. 08/31/19   Joni Reining, PA-C  isosorbide dinitrate (ISORDIL) 10 MG tablet Take 10 mg by mouth 2 (two) times daily.    [provider]  methocarbamol (ROBAXIN) 500 MG tablet Take 1 tablet (500 mg total) by mouth 3 (three) times daily. 08/14/20   Jene Every, MD  methylPREDNISolone (MEDROL DOSEPAK) 4 MG  TBPK tablet Take Tapered dose as directed 08/31/19   Joni Reining, PA-C  naproxen (NAPROSYN) 500 MG tablet Take 1 tablet (500 mg total) by mouth 2 (two) times daily with a meal. 08/14/20   Jene Every, MD  traMADol (ULTRAM) 50 MG tablet Take 1 tablet (50 mg total) by mouth every 6 (six) hours as needed for moderate pain. 08/31/19   Joni Reining, PA-C    Allergies  Allergen Reactions   Bactrim [Sulfamethoxazole-Trimethoprim] Anaphylaxis   Bee Venom Anaphylaxis   Compazine [Prochlorperazine] Anaphylaxis   Contrast Media [Iodinated Diagnostic Agents] Anaphylaxis   Seroquel [Quetiapine] Anaphylaxis    History reviewed. No pertinent family history.  Social History Social History   Tobacco Use   Smoking status: Every Day    Pack years: 0.00   Smokeless tobacco: Never  Substance Use Topics   Alcohol use: Yes    Review of Systems Constitutional: Subjective fever Cardiovascular: Mild dull central chest pain/tightness Respiratory: Mild shortness of breath.  Positive for cough. Gastrointestinal: Negative for abdominal pain, vomiting.  Positive for nausea and diarrhea. Genitourinary: Negative for urinary compaints Musculoskeletal: Negative for musculoskeletal complaints Neurological: Negative for headache All other ROS negative  ____________________________________________   PHYSICAL EXAM:  VITAL SIGNS: ED Triage Vitals  Enc Vitals Group     BP 08/16/20 1133 (!) 122/53     Pulse Rate 08/16/20 1129 83     Resp 08/16/20 1129  17     Temp 08/16/20 1133 97.9 F (36.6 C)     Temp Source 08/16/20 1133 Oral     SpO2 08/16/20 1129 98 %     Weight 08/16/20 1053 200 lb (90.7 kg)     Height 08/16/20 1053 5' (1.524 m)     Head Circumference --      Peak Flow --      Pain Score 08/16/20 1053 8     Pain Loc --      Pain Edu? --      Excl. in GC? --    Constitutional: Alert and oriented. Well appearing and in no distress. Eyes: Normal exam ENT      Head: Normocephalic and  atraumatic.      Mouth/Throat: Mucous membranes are moist. Cardiovascular: Normal rate, regular rhythm Respiratory: Normal respiratory effort without tachypnea nor retractions. Breath sounds are clear  Gastrointestinal: Soft and nontender. No distention.   Musculoskeletal: Nontender with normal range of motion in all extremities.  Neurologic:  Normal speech and language. No gross focal neurologic deficits  Skin:  Skin is warm, dry and intact.  Psychiatric: Mood and affect are normal.   ____________________________________________    EKG  EKG viewed and interpreted by myself shows a normal sinus rhythm 89 bpm with a narrow QRS, normal axis, normal intervals, no concerning ST changes.  ____________________________________________    RADIOLOGY  Chest x-ray negative  ____________________________________________   INITIAL IMPRESSION / ASSESSMENT AND PLAN / ED COURSE  Pertinent labs & imaging results that were available during my care of the patient were reviewed by me and considered in my medical decision making (see chart for details).   Patient with emergency department for cough headache chest pain subjective fever.  States she took a home COVID test that was positive.  We will check labs including cardiac enzyme.  We will obtain a chest x-ray, PCR COVID swab.  We will dose IV fluids nausea medication Toradol and Tussionex.  Patient also states on review of systems questioning she has been experiencing difficulty urinating and dysuria believes she has urinary tract infection we will check a urinalysis as well.  Patient's COVID test is positive.  Remainder the patient's work-up is largely nonrevealing, besides a urinalysis that does show 21-50 white blood cells and rare bacteria.  Given the patient's dysuria we will treat with a one-time dose of fosfomycin and send a urine culture.  We will discharge the patient with paxlovid prescription.  Discussed my typical COVID return precautions.   Patient agreeable to plan of care.  Katelyn Haynes was evaluated in Emergency Department on 08/16/2020 for the symptoms described in the history of present illness. She was evaluated in the context of the global COVID-19 pandemic, which necessitated consideration that the patient might be at risk for infection with the SARS-CoV-2 virus that causes COVID-19. Institutional protocols and algorithms that pertain to the evaluation of patients at risk for COVID-19 are in a state of rapid change based on information released by regulatory bodies including the CDC and federal and state organizations. These policies and algorithms were followed during the patient's care in the ED.  ____________________________________________   FINAL CLINICAL IMPRESSION(S) / ED DIAGNOSES  COVID-19   Minna Antis, MD 08/16/20 1325

## 2020-08-29 ENCOUNTER — Ambulatory Visit: Payer: Self-pay | Admitting: *Deleted

## 2020-08-29 NOTE — Telephone Encounter (Signed)
Pt called stating that she is feeling very weak and sick from Covid. She is requesting to speak with a nurse. Please advise  Called patient regarding symptoms. No answer, left voicemail to call back #[phone].

## 2020-08-29 NOTE — Telephone Encounter (Signed)
3 attempts made to return pt's call without success.  Messages were left.   Will close chart since 3 attempts have been made.

## 2021-07-15 ENCOUNTER — Emergency Department
Admission: EM | Admit: 2021-07-15 | Discharge: 2021-07-15 | Discharge status: Against Medical Advice | Payer: Medicaid Other | Attending: Emergency Medicine | Admitting: Emergency Medicine

## 2021-07-15 ENCOUNTER — Other Ambulatory Visit: Payer: Self-pay

## 2021-07-15 DIAGNOSIS — G4489 Other headache syndrome: Secondary | ICD-10-CM | POA: Diagnosis not present

## 2021-07-15 DIAGNOSIS — R110 Nausea: Secondary | ICD-10-CM | POA: Diagnosis not present

## 2021-07-15 DIAGNOSIS — R519 Headache, unspecified: Secondary | ICD-10-CM | POA: Insufficient documentation

## 2021-07-15 DIAGNOSIS — R059 Cough, unspecified: Secondary | ICD-10-CM | POA: Insufficient documentation

## 2021-07-15 DIAGNOSIS — Z20822 Contact with and (suspected) exposure to covid-19: Secondary | ICD-10-CM | POA: Insufficient documentation

## 2021-07-15 DIAGNOSIS — R0689 Other abnormalities of breathing: Secondary | ICD-10-CM | POA: Diagnosis not present

## 2021-07-15 DIAGNOSIS — Z5321 Procedure and treatment not carried out due to patient leaving prior to being seen by health care provider: Secondary | ICD-10-CM | POA: Insufficient documentation

## 2021-07-15 LAB — BASIC METABOLIC PANEL
Anion gap: 6 (ref 5–15)
BUN: 11 mg/dL (ref 6–20)
CO2: 24 mmol/L (ref 22–32)
Calcium: 9 mg/dL (ref 8.9–10.3)
Chloride: 106 mmol/L (ref 98–111)
Creatinine, Ser: 0.56 mg/dL (ref 0.44–1.00)
GFR, Estimated: >60 mL/min (ref >60)
Glucose, Bld: 110 mg/dL — ABNORMAL HIGH (ref 70–99)
Potassium: 3.7 mmol/L (ref 3.5–5.1)
Sodium: 136 mmol/L (ref 135–145)

## 2021-07-15 LAB — URINALYSIS, ROUTINE W REFLEX MICROSCOPIC
Bilirubin Urine: NEGATIVE
Glucose, UA: NEGATIVE mg/dL
Ketones, ur: NEGATIVE mg/dL
Nitrite: NEGATIVE
Protein, ur: NEGATIVE mg/dL
Specific Gravity, Urine: 1.011 (ref 1.005–1.030)
pH: 6 (ref 5.0–8.0)

## 2021-07-15 LAB — POC URINE PREG, ED: Preg Test, Ur: NEGATIVE

## 2021-07-15 LAB — CBC
HCT: 42.4 % (ref 36.0–46.0)
Hemoglobin: 14.9 g/dL (ref 12.0–15.0)
MCH: 34.7 pg — ABNORMAL HIGH (ref 26.0–34.0)
MCHC: 35.1 g/dL (ref 30.0–36.0)
MCV: 98.8 fL (ref 80.0–100.0)
Platelets: 242 10*3/uL (ref 150–400)
RBC: 4.29 MIL/uL (ref 3.87–5.11)
RDW: 11.9 % (ref 11.5–15.5)
WBC: 9.5 10*3/uL (ref 4.0–10.5)
nRBC: 0 % (ref 0.0–0.2)

## 2021-07-15 NOTE — ED Triage Notes (Signed)
First Nurse Note: Pt via EMS from home. Pt c/o headache and radiates down the neck for the past 2 weeks. Pt states the pain is worse when she coughs. Pt also c/o nausea. Pt has a hx of migraine. Denies being on any prescribed medications.   20G R AC. EMS gave 4 of Zofran  103 CBG 126/66 BP 98% on RA 82 NSR  44 ETCO2  12 RR

## 2021-07-16 LAB — RESP PANEL BY RT-PCR (FLU A&B, COVID) ARPGX2
Influenza A by PCR: NEGATIVE
Influenza B by PCR: NEGATIVE
SARS Coronavirus 2 by RT PCR: NEGATIVE

## 2021-07-24 DIAGNOSIS — E785 Hyperlipidemia, unspecified: Secondary | ICD-10-CM | POA: Diagnosis not present

## 2021-07-24 DIAGNOSIS — D519 Vitamin B12 deficiency anemia, unspecified: Secondary | ICD-10-CM | POA: Diagnosis not present

## 2021-09-28 ENCOUNTER — Emergency Department: Payer: 59

## 2021-09-28 ENCOUNTER — Encounter: Payer: Self-pay | Admitting: Emergency Medicine

## 2021-09-28 ENCOUNTER — Emergency Department
Admission: EM | Admit: 2021-09-28 | Discharge: 2021-09-28 | Disposition: A | Discharge status: Home / Self Care | Payer: 59 | Attending: Emergency Medicine | Admitting: Emergency Medicine

## 2021-09-28 DIAGNOSIS — R339 Retention of urine, unspecified: Secondary | ICD-10-CM | POA: Diagnosis not present

## 2021-09-28 DIAGNOSIS — R079 Chest pain, unspecified: Secondary | ICD-10-CM | POA: Diagnosis not present

## 2021-09-28 DIAGNOSIS — R0789 Other chest pain: Secondary | ICD-10-CM | POA: Diagnosis not present

## 2021-09-28 DIAGNOSIS — N3000 Acute cystitis without hematuria: Secondary | ICD-10-CM | POA: Diagnosis not present

## 2021-09-28 LAB — COMPREHENSIVE METABOLIC PANEL
ALT: 23 U/L (ref 0–44)
AST: 22 U/L (ref 15–41)
Albumin: 3.8 g/dL (ref 3.5–5.0)
Alkaline Phosphatase: 69 U/L (ref 38–126)
Anion gap: 8 (ref 5–15)
BUN: 5 mg/dL — ABNORMAL LOW (ref 6–20)
CO2: 21 mmol/L — ABNORMAL LOW (ref 22–32)
Calcium: 8.5 mg/dL — ABNORMAL LOW (ref 8.9–10.3)
Chloride: 114 mmol/L — ABNORMAL HIGH (ref 98–111)
Creatinine, Ser: 0.52 mg/dL (ref 0.44–1.00)
GFR, Estimated: >60 mL/min (ref >60)
Glucose, Bld: 102 mg/dL — ABNORMAL HIGH (ref 70–99)
Potassium: 3.4 mmol/L — ABNORMAL LOW (ref 3.5–5.1)
Sodium: 143 mmol/L (ref 135–145)
Total Bilirubin: 0.8 mg/dL (ref 0.3–1.2)
Total Protein: 7.1 g/dL (ref 6.5–8.1)

## 2021-09-28 LAB — CBC WITH DIFFERENTIAL/PLATELET
Abs Immature Granulocytes: 0.02 10*3/uL (ref 0.00–0.07)
Basophils Absolute: 0 10*3/uL (ref 0.0–0.1)
Basophils Relative: 0 %
Eosinophils Absolute: 0.2 10*3/uL (ref 0.0–0.5)
Eosinophils Relative: 3 %
HCT: 40.9 % (ref 36.0–46.0)
Hemoglobin: 14.5 g/dL (ref 12.0–15.0)
Immature Granulocytes: 0 %
Lymphocytes Relative: 44 %
Lymphs Abs: 2.7 10*3/uL (ref 0.7–4.0)
MCH: 35.9 pg — ABNORMAL HIGH (ref 26.0–34.0)
MCHC: 35.5 g/dL (ref 30.0–36.0)
MCV: 101.2 fL — ABNORMAL HIGH (ref 80.0–100.0)
Monocytes Absolute: 0.6 10*3/uL (ref 0.1–1.0)
Monocytes Relative: 10 %
Neutro Abs: 2.7 10*3/uL (ref 1.7–7.7)
Neutrophils Relative %: 43 %
Platelets: 211 10*3/uL (ref 150–400)
RBC: 4.04 MIL/uL (ref 3.87–5.11)
RDW: 12.9 % (ref 11.5–15.5)
WBC: 6.2 10*3/uL (ref 4.0–10.5)
nRBC: 0 % (ref 0.0–0.2)

## 2021-09-28 LAB — URINALYSIS, ROUTINE W REFLEX MICROSCOPIC
Bilirubin Urine: NEGATIVE
Glucose, UA: NEGATIVE mg/dL
Hgb urine dipstick: NEGATIVE
Ketones, ur: NEGATIVE mg/dL
Nitrite: NEGATIVE
Protein, ur: 30 mg/dL — AB
Specific Gravity, Urine: 1.021 (ref 1.005–1.030)
pH: 5 (ref 5.0–8.0)

## 2021-09-28 LAB — TROPONIN I (HIGH SENSITIVITY)
Troponin I (High Sensitivity): 3 ng/L (ref <18)
Troponin I (High Sensitivity): 3 ng/L (ref <18)

## 2021-09-28 MED ORDER — CEPHALEXIN 500 MG PO CAPS: 500.0000 mg | ORAL_CAPSULE | Freq: Four times a day (QID) | ORAL | 0 refills | Status: AC

## 2021-09-28 MED ORDER — ACETAMINOPHEN 325 MG PO TABS
325.0000 mg | ORAL_TABLET | Freq: Once | ORAL | Status: AC
  Administered 2021-09-28: 325 mg via ORAL
  Filled 2021-09-28: qty 1

## 2021-09-28 MED ORDER — LACTATED RINGERS IV BOLUS
1000.0000 mL | Freq: Once | INTRAVENOUS | Status: AC
  Administered 2021-09-28: 1000 mL via INTRAVENOUS

## 2021-09-28 MED ORDER — CEPHALEXIN 500 MG PO CAPS: 500.0000 mg | ORAL_CAPSULE | Freq: Once | ORAL | Status: DC

## 2021-09-28 MED ORDER — BUTALBITAL-APAP-CAFFEINE 50-325-40 MG PO TABS
2.0000 | ORAL_TABLET | Freq: Once | ORAL | Status: AC
  Administered 2021-09-28: 2 via ORAL
  Filled 2021-09-28 (×2): qty 2

## 2021-09-28 MED ORDER — SODIUM CHLORIDE 0.9 % IV SOLN
1.0000 g | Freq: Once | INTRAVENOUS | Status: AC
  Administered 2021-09-28: 1 g via INTRAVENOUS
  Filled 2021-09-28: qty 10

## 2021-09-28 NOTE — ED Provider Notes (Signed)
[facility] Provider Note    Event Date/Time   First MD Initiated Contact with Patient 09/28/21 [phone]     (approximate)   History   Chest Pain and Urinary Retention   HPI  Katelyn Haynes is a 46 y.o. female who presents to the ED for evaluation of Chest Pain and Urinary Retention   I reviewed cardiology clinic visit from 1 year ago from [place].  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  2010 left heart cath with normal coronary arteries.   Patient presents to the ED for evaluation of acute on chronic chest pain.  She reports she feels chest pain almost every day, she has been having chest pain for the past 1 hour that is typical for her.  No associated dyspnea, cough, increased productive coughm, syncope or fevers.  Does reports 1-2 weeks of dysuria and urinary frequency as well.  No recent antibiotics.  No flank pain or fever.  No new vaginal discharge or bleeding.   Physical Exam   Triage Vital Signs: ED Triage Vitals  Enc Vitals Group     BP      Pulse      Resp      Temp      Temp src      SpO2      Weight      Height      Head Circumference      Peak Flow      Pain Score      Pain Loc      Pain Edu?      Excl. in GC?     Most recent vital signs: Vitals:   09/28/21 0612  BP: 93/72  Pulse: 85  Resp: 20  Temp: 97.9 F (36.6 C)  SpO2: 98%    General: Awake, no distress.  CV:  Good peripheral perfusion. RRR Resp:  Normal effort. CTAB Abd:  No distention.  Mild suprapubic tenderness without peritoneal features.  Abdomen is otherwise benign. MSK:  No deformity noted.  Neuro:  No focal deficits appreciated. Other:     ED Results / Procedures / Treatments   Labs (all labs ordered are listed, but only abnormal results are displayed) Labs Reviewed  URINALYSIS, ROUTINE W REFLEX MICROSCOPIC - Abnormal; Notable for the following components:      Result Value   Color, Urine YELLOW (*)    APPearance CLOUDY (*)    Protein, ur 30 (*)     Leukocytes,Ua LARGE (*)    Bacteria, UA FEW (*)    All other components within normal limits  COMPREHENSIVE METABOLIC PANEL - Abnormal; Notable for the following components:   Potassium 3.4 (*)    Chloride 114 (*)    CO2 21 (*)    Glucose, Bld 102 (*)    BUN 5 (*)    Calcium 8.5 (*)    All other components within normal limits  CBC WITH DIFFERENTIAL/PLATELET - Abnormal; Notable for the following components:   MCV 101.2 (*)    MCH 35.9 (*)    All other components within normal limits  URINE CULTURE  TROPONIN I (HIGH SENSITIVITY)    EKG Sinus rhythm with a rate of 84 bpm.  Normal axis and intervals.  Nonspecific ST changes to septal and inferior leads.  No STEMI.  So Similar morphology as EKG from May  RADIOLOGY CXR interpreted by me without evidence of acute cardiopulmonary pathology.  Official radiology report(s): DG Chest 2 View  Result Date:  09/28/2021 CLINICAL DATA:  Chest pain EXAM: CHEST - 2 VIEW COMPARISON:  08/16/2020 FINDINGS: Normal heart size and mediastinal contours. No acute infiltrate or edema. No effusion or pneumothorax. No acute osseous findings. Artifact from EKG leads. IMPRESSION: No active cardiopulmonary disease. Electronically Signed   By: Tiburcio Pea M.D.   On: 09/28/2021 07:02    PROCEDURES and INTERVENTIONS:  .1-3 Lead EKG Interpretation  Performed by: Delton Prairie, MD Authorized by: Delton Prairie, MD     Interpretation: normal     ECG rate:  80   ECG rate assessment: normal     Rhythm: sinus rhythm     Ectopy: none     Conduction: normal     Medications  cefTRIAXone (ROCEPHIN) 1 g in sodium chloride 0.9 % 100 mL IVPB (has no administration in time range)  lactated ringers bolus 1,000 mL (1,000 mLs Intravenous New Bag/Given 09/28/21 0716)  butalbital-acetaminophen-caffeine (FIORICET) 50-325-40 MG per tablet 2 tablet (2 tablets Oral Given 09/28/21 0727)     IMPRESSION / MDM / ASSESSMENT AND PLAN / ED COURSE  I reviewed the triage vital  signs and the nursing notes.  Differential diagnosis includes, but is not limited to, ACS, PTX, PNA, muscle strain/spasm, PE, dissection, cystitis, pyelonephritis, sepsis.  {Patient presents with symptoms of an acute illness or injury that is potentially life-threatening.  Patient presents to the ED for the patient of acute on chronic chest pain as well as acute dysuria.  Has evidence of acute cystitis and likely will be suitable for trial of outpatient management.  Her EKG is nonischemic and has a negative troponin.  CXR is clear.  She tells me she has chest pains daily for many years and the syndrome today is of a similar quality as her typical chest pain.  I doubt an acute cardiopulmonary pathology, but we will hold her to release get a second troponin and keep her on the monitor to ensure she has no dysrhythmias prior to expected outpatient management. Blood work with no leukocytosis or signs of sepsis.  CMP with marginal decreased bicarbonate, but no anion gap.  Urine will be sent for culture and she is started on antibiotics.      FINAL CLINICAL IMPRESSION(S) / ED DIAGNOSES   Final diagnoses:  Acute cystitis without hematuria  Other chest pain     Rx / DC Orders   ED Discharge Orders          Ordered    cephALEXin (KEFLEX) 500 MG capsule  4 times daily        09/28/21 3154             Note:  This document was prepared using Dragon voice recognition software and may include unintentional dictation errors.   Delton Prairie, MD 09/28/21 [phone]

## 2021-09-28 NOTE — ED Triage Notes (Signed)
Pt presents via POV with complaints of mid-sternal CP that started 1 hour ago with associated headache and nausea. Pt states that she hasnt voided fully in several days. Pt notes foul odor and burning with urination. Denies fever or SOB.

## 2021-09-28 NOTE — ED Notes (Signed)
ED Provider at bedside. 

## 2021-09-28 NOTE — ED Notes (Signed)
Transported to xray 

## 2021-09-29 LAB — URINE CULTURE

## 2021-10-24 DIAGNOSIS — R300 Dysuria: Secondary | ICD-10-CM | POA: Diagnosis not present

## 2021-12-12 ENCOUNTER — Other Ambulatory Visit: Payer: Self-pay

## 2021-12-12 ENCOUNTER — Emergency Department: Payer: 59

## 2021-12-12 ENCOUNTER — Encounter: Payer: Self-pay | Admitting: Emergency Medicine

## 2021-12-12 ENCOUNTER — Emergency Department
Admission: EM | Admit: 2021-12-12 | Discharge: 2021-12-12 | Disposition: A | Discharge status: Home / Self Care | Payer: 59 | Attending: Emergency Medicine | Admitting: Emergency Medicine

## 2021-12-12 DIAGNOSIS — M545 Low back pain, unspecified: Secondary | ICD-10-CM | POA: Insufficient documentation

## 2021-12-12 DIAGNOSIS — N390 Urinary tract infection, site not specified: Secondary | ICD-10-CM | POA: Insufficient documentation

## 2021-12-12 DIAGNOSIS — R112 Nausea with vomiting, unspecified: Secondary | ICD-10-CM | POA: Diagnosis not present

## 2021-12-12 DIAGNOSIS — R519 Headache, unspecified: Secondary | ICD-10-CM | POA: Diagnosis not present

## 2021-12-12 LAB — COMPREHENSIVE METABOLIC PANEL
ALT: 21 U/L (ref 0–44)
AST: 26 U/L (ref 15–41)
Albumin: 3.8 g/dL (ref 3.5–5.0)
Alkaline Phosphatase: 66 U/L (ref 38–126)
Anion gap: 6 (ref 5–15)
BUN: 6 mg/dL (ref 6–20)
CO2: 23 mmol/L (ref 22–32)
Calcium: 8.8 mg/dL — ABNORMAL LOW (ref 8.9–10.3)
Chloride: 113 mmol/L — ABNORMAL HIGH (ref 98–111)
Creatinine, Ser: 0.6 mg/dL (ref 0.44–1.00)
GFR, Estimated: >60 mL/min (ref >60)
Glucose, Bld: 101 mg/dL — ABNORMAL HIGH (ref 70–99)
Potassium: 3.5 mmol/L (ref 3.5–5.1)
Sodium: 142 mmol/L (ref 135–145)
Total Bilirubin: 0.8 mg/dL (ref 0.3–1.2)
Total Protein: 7 g/dL (ref 6.5–8.1)

## 2021-12-12 LAB — URINALYSIS, ROUTINE W REFLEX MICROSCOPIC
Bacteria, UA: NONE SEEN
Bilirubin Urine: NEGATIVE
Glucose, UA: NEGATIVE mg/dL
Hgb urine dipstick: NEGATIVE
Ketones, ur: NEGATIVE mg/dL
Nitrite: NEGATIVE
Protein, ur: NEGATIVE mg/dL
Specific Gravity, Urine: 1.017 (ref 1.005–1.030)
WBC, UA: >50 WBC/hpf — ABNORMAL HIGH (ref 0–5)
pH: 5 (ref 5.0–8.0)

## 2021-12-12 LAB — CBC WITH DIFFERENTIAL/PLATELET
Abs Immature Granulocytes: 0.01 10*3/uL (ref 0.00–0.07)
Basophils Absolute: 0 10*3/uL (ref 0.0–0.1)
Basophils Relative: 0 %
Eosinophils Absolute: 0.3 10*3/uL (ref 0.0–0.5)
Eosinophils Relative: 5 %
HCT: 41.7 % (ref 36.0–46.0)
Hemoglobin: 14.7 g/dL (ref 12.0–15.0)
Immature Granulocytes: 0 %
Lymphocytes Relative: 40 %
Lymphs Abs: 2.3 10*3/uL (ref 0.7–4.0)
MCH: 36.6 pg — ABNORMAL HIGH (ref 26.0–34.0)
MCHC: 35.3 g/dL (ref 30.0–36.0)
MCV: 103.7 fL — ABNORMAL HIGH (ref 80.0–100.0)
Monocytes Absolute: 0.5 10*3/uL (ref 0.1–1.0)
Monocytes Relative: 9 %
Neutro Abs: 2.5 10*3/uL (ref 1.7–7.7)
Neutrophils Relative %: 46 %
Platelets: 258 10*3/uL (ref 150–400)
RBC: 4.02 MIL/uL (ref 3.87–5.11)
RDW: 12.8 % (ref 11.5–15.5)
WBC: 5.7 10*3/uL (ref 4.0–10.5)
nRBC: 0 % (ref 0.0–0.2)

## 2021-12-12 LAB — PREGNANCY, URINE: Preg Test, Ur: NEGATIVE

## 2021-12-12 MED ORDER — CEPHALEXIN 500 MG PO CAPS: 500.0000 mg | ORAL_CAPSULE | Freq: Two times a day (BID) | ORAL | 0 refills | Status: DC

## 2021-12-12 MED ORDER — KETOROLAC TROMETHAMINE 30 MG/ML IJ SOLN
30.0000 mg | Freq: Once | INTRAMUSCULAR | Status: AC
  Administered 2021-12-12: 30 mg via INTRAMUSCULAR
  Filled 2021-12-12: qty 1

## 2021-12-12 MED ORDER — CEPHALEXIN 500 MG PO CAPS
500.0000 mg | ORAL_CAPSULE | Freq: Once | ORAL | Status: AC
  Administered 2021-12-12: 500 mg via ORAL
  Filled 2021-12-12: qty 1

## 2021-12-12 MED ORDER — ONDANSETRON 4 MG PO TBDP: 4.0000 mg | ORAL_TABLET | Freq: Three times a day (TID) | ORAL | 0 refills | Status: DC | PRN

## 2021-12-12 NOTE — ED Triage Notes (Signed)
Patient ambulatory to triage with steady gait, without difficulty or distress noted; pt reports frontal HA since 2am "like a vise" accomp by nausea; also c/o dysuria and lower back pain

## 2021-12-12 NOTE — ED Provider Notes (Signed)
[facility] Provider Note    Event Date/Time   First MD Initiated Contact with Patient 12/12/21 [phone]     (approximate)   History   Headache   HPI  Katelyn Haynes is a 46 y.o. female with a history of migraine headaches who presents with complaints of UTI symptoms, nausea and vomiting.  She reports overnight while she was having episode of vomiting she developed a sharp frontal headache.  She denies neurodeficits.  She denies flank pain, does complain of bilateral low back pain.  No fevers     Physical Exam   Triage Vital Signs: ED Triage Vitals [12/12/21 0602]  Enc Vitals Group     BP 126/84     Pulse Rate 77     Resp 18     Temp 97.8 F (36.6 C)     Temp Source Oral     SpO2 98 %     Weight 88.9 kg (196 lb)     Height 1.524 m (5')     Head Circumference      Peak Flow      Pain Score 7     Pain Loc      Pain Edu?      Excl. in GC?     Most recent vital signs: Vitals:   12/12/21 0602 12/12/21 0819  BP: 126/84 124/80  Pulse: 77 78  Resp: 18 18  Temp: 97.8 F (36.6 C) 97.8 F (36.6 C)  SpO2: 98% 98%     General: Awake, no distress.  CV:  Good peripheral perfusion.  Resp:  Normal effort.  Abd:  No distention.  No CVA tenderness reassuring exam Other:  Cranial nerves II through XII are normal, ambulates well without difficulty   ED Results / Procedures / Treatments   Labs (all labs ordered are listed, but only abnormal results are displayed) Labs Reviewed  CBC WITH DIFFERENTIAL/PLATELET - Abnormal; Notable for the following components:      Result Value   MCV 103.7 (*)    MCH 36.6 (*)    All other components within normal limits  COMPREHENSIVE METABOLIC PANEL - Abnormal; Notable for the following components:   Chloride 113 (*)    Glucose, Bld 101 (*)    Calcium 8.8 (*)    All other components within normal limits  URINALYSIS, ROUTINE W REFLEX MICROSCOPIC - Abnormal; Notable for the following components:   Color, Urine  YELLOW (*)    APPearance HAZY (*)    Leukocytes,Ua LARGE (*)    WBC, UA >50 (*)    All other components within normal limits  URINE CULTURE  PREGNANCY, URINE     EKG     RADIOLOGY CT head viewed interpreted by me, no acute abnormality    PROCEDURES:  Critical Care performed:   Procedures   MEDICATIONS ORDERED IN ED: Medications  ketorolac (TORADOL) 30 MG/ML injection 30 mg (30 mg Intramuscular Given 12/12/21 0741)  cephALEXin (KEFLEX) capsule 500 mg (500 mg Oral Given 12/12/21 0741)     IMPRESSION / MDM / ASSESSMENT AND PLAN / ED COURSE  I reviewed the triage vital signs and the nursing notes. Patient's presentation is most consistent with acute presentation with potential threat to life or bodily function.  Patient reports multiple symptoms including dysuria, diarrhea, nausea vomiting, headache.  Suspicious for possible viral symptom in conjunction with UTI.  Not consistent with sepsis, vital signs quite reassuring, afebrile, no CVA tenderness, normal white blood cell count.  Urinalysis consistent with UTI, will send for CT head given development of headache during vomiting however overall exam is reassuring.  Will treat with IM analgesic, p.o. antibiotics and reevaluate.  CT scan is reassuring, patient is feeling improved after treatment, appropriate discharge at this time with return precautions.      FINAL CLINICAL IMPRESSION(S) / ED DIAGNOSES   Final diagnoses:  Lower urinary tract infectious disease     Rx / DC Orders   ED Discharge Orders          Ordered    cephALEXin (KEFLEX) 500 MG capsule  2 times daily        12/12/21 0811    ondansetron (ZOFRAN-ODT) 4 MG disintegrating tablet  Every 8 hours PRN        12/12/21 4481             Note:  This document was prepared using Dragon voice recognition software and may include unintentional dictation errors.   Lavonia Drafts, MD 12/12/21 [phone]

## 2021-12-13 LAB — URINE CULTURE: Culture: <10000 — AB

## 2022-01-02 DIAGNOSIS — N390 Urinary tract infection, site not specified: Secondary | ICD-10-CM | POA: Diagnosis not present

## 2022-01-02 DIAGNOSIS — D519 Vitamin B12 deficiency anemia, unspecified: Secondary | ICD-10-CM | POA: Diagnosis not present

## 2022-01-02 DIAGNOSIS — E878 Other disorders of electrolyte and fluid balance, not elsewhere classified: Secondary | ICD-10-CM | POA: Diagnosis not present

## 2022-01-28 DIAGNOSIS — R61 Generalized hyperhidrosis: Secondary | ICD-10-CM | POA: Diagnosis not present

## 2022-01-28 DIAGNOSIS — J4520 Mild intermittent asthma, uncomplicated: Secondary | ICD-10-CM | POA: Diagnosis not present

## 2022-01-28 DIAGNOSIS — K219 Gastro-esophageal reflux disease without esophagitis: Secondary | ICD-10-CM | POA: Diagnosis not present

## 2022-01-28 DIAGNOSIS — Z720 Tobacco use: Secondary | ICD-10-CM | POA: Diagnosis not present

## 2022-01-28 DIAGNOSIS — J4489 Other specified chronic obstructive pulmonary disease: Secondary | ICD-10-CM | POA: Diagnosis not present

## 2022-01-28 DIAGNOSIS — R0602 Shortness of breath: Secondary | ICD-10-CM | POA: Diagnosis not present

## 2022-01-28 DIAGNOSIS — Z129 Encounter for screening for malignant neoplasm, site unspecified: Secondary | ICD-10-CM | POA: Diagnosis not present

## 2022-01-28 DIAGNOSIS — R911 Solitary pulmonary nodule: Secondary | ICD-10-CM | POA: Diagnosis not present

## 2022-01-28 DIAGNOSIS — G4733 Obstructive sleep apnea (adult) (pediatric): Secondary | ICD-10-CM | POA: Diagnosis not present

## 2022-01-28 DIAGNOSIS — R079 Chest pain, unspecified: Secondary | ICD-10-CM | POA: Diagnosis not present

## 2022-01-29 DIAGNOSIS — R911 Solitary pulmonary nodule: Secondary | ICD-10-CM | POA: Insufficient documentation

## 2022-01-29 DIAGNOSIS — R61 Generalized hyperhidrosis: Secondary | ICD-10-CM | POA: Insufficient documentation

## 2022-01-29 DIAGNOSIS — Z720 Tobacco use: Secondary | ICD-10-CM | POA: Insufficient documentation

## 2022-02-17 DIAGNOSIS — Z129 Encounter for screening for malignant neoplasm, site unspecified: Secondary | ICD-10-CM | POA: Diagnosis not present

## 2022-02-17 DIAGNOSIS — J8410 Pulmonary fibrosis, unspecified: Secondary | ICD-10-CM | POA: Diagnosis not present

## 2022-02-17 DIAGNOSIS — R911 Solitary pulmonary nodule: Secondary | ICD-10-CM | POA: Diagnosis not present

## 2022-02-18 ENCOUNTER — Emergency Department: Payer: 59

## 2022-02-18 ENCOUNTER — Other Ambulatory Visit: Payer: Self-pay

## 2022-02-18 DIAGNOSIS — W19XXXA Unspecified fall, initial encounter: Secondary | ICD-10-CM | POA: Diagnosis not present

## 2022-02-18 DIAGNOSIS — M25561 Pain in right knee: Secondary | ICD-10-CM | POA: Insufficient documentation

## 2022-02-18 DIAGNOSIS — M25521 Pain in right elbow: Secondary | ICD-10-CM | POA: Insufficient documentation

## 2022-02-18 DIAGNOSIS — W109XXA Fall (on) (from) unspecified stairs and steps, initial encounter: Secondary | ICD-10-CM | POA: Diagnosis not present

## 2022-02-18 DIAGNOSIS — Z743 Need for continuous supervision: Secondary | ICD-10-CM | POA: Diagnosis not present

## 2022-02-18 DIAGNOSIS — Z5321 Procedure and treatment not carried out due to patient leaving prior to being seen by health care provider: Secondary | ICD-10-CM | POA: Insufficient documentation

## 2022-02-18 DIAGNOSIS — S0083XA Contusion of other part of head, initial encounter: Secondary | ICD-10-CM | POA: Diagnosis not present

## 2022-02-18 DIAGNOSIS — R110 Nausea: Secondary | ICD-10-CM | POA: Insufficient documentation

## 2022-02-18 DIAGNOSIS — M546 Pain in thoracic spine: Secondary | ICD-10-CM | POA: Diagnosis not present

## 2022-02-18 DIAGNOSIS — S0993XA Unspecified injury of face, initial encounter: Secondary | ICD-10-CM | POA: Diagnosis not present

## 2022-02-18 DIAGNOSIS — M542 Cervicalgia: Secondary | ICD-10-CM | POA: Diagnosis not present

## 2022-02-18 DIAGNOSIS — S8991XA Unspecified injury of right lower leg, initial encounter: Secondary | ICD-10-CM | POA: Diagnosis not present

## 2022-02-18 NOTE — ED Notes (Signed)
Per ems pt fell down 13 stairs, hit head, bruising noted to face, LOC per ems. Pt complains of neck pain, upper back pain, right knee, right elbow. Pt states she feels nauseated.

## 2022-02-19 ENCOUNTER — Emergency Department
Admission: EM | Admit: 2022-02-19 | Discharge: 2022-02-19 | Discharge status: Against Medical Advice | Payer: 59 | Attending: Emergency Medicine | Admitting: Emergency Medicine

## 2022-02-19 NOTE — ED Notes (Signed)
Pt updated on wait time, pt left lobby cussing.

## 2022-02-20 DIAGNOSIS — M25511 Pain in right shoulder: Secondary | ICD-10-CM | POA: Diagnosis not present

## 2022-03-13 IMAGING — CR DG CERVICAL SPINE 2 OR 3 VIEWS
1 series · 4 of 4 positions shown · non-contrast
Comparison: 07/01/2019.

CLINICAL DATA: Radicular pain right upper extremity.

EXAM:
CERVICAL SPINE - 2-3 VIEW

[Series 1: dg cervical spine 2 or 3 views · 0.14mm/px · 4 of 4 slices shown]
[im 1/4]
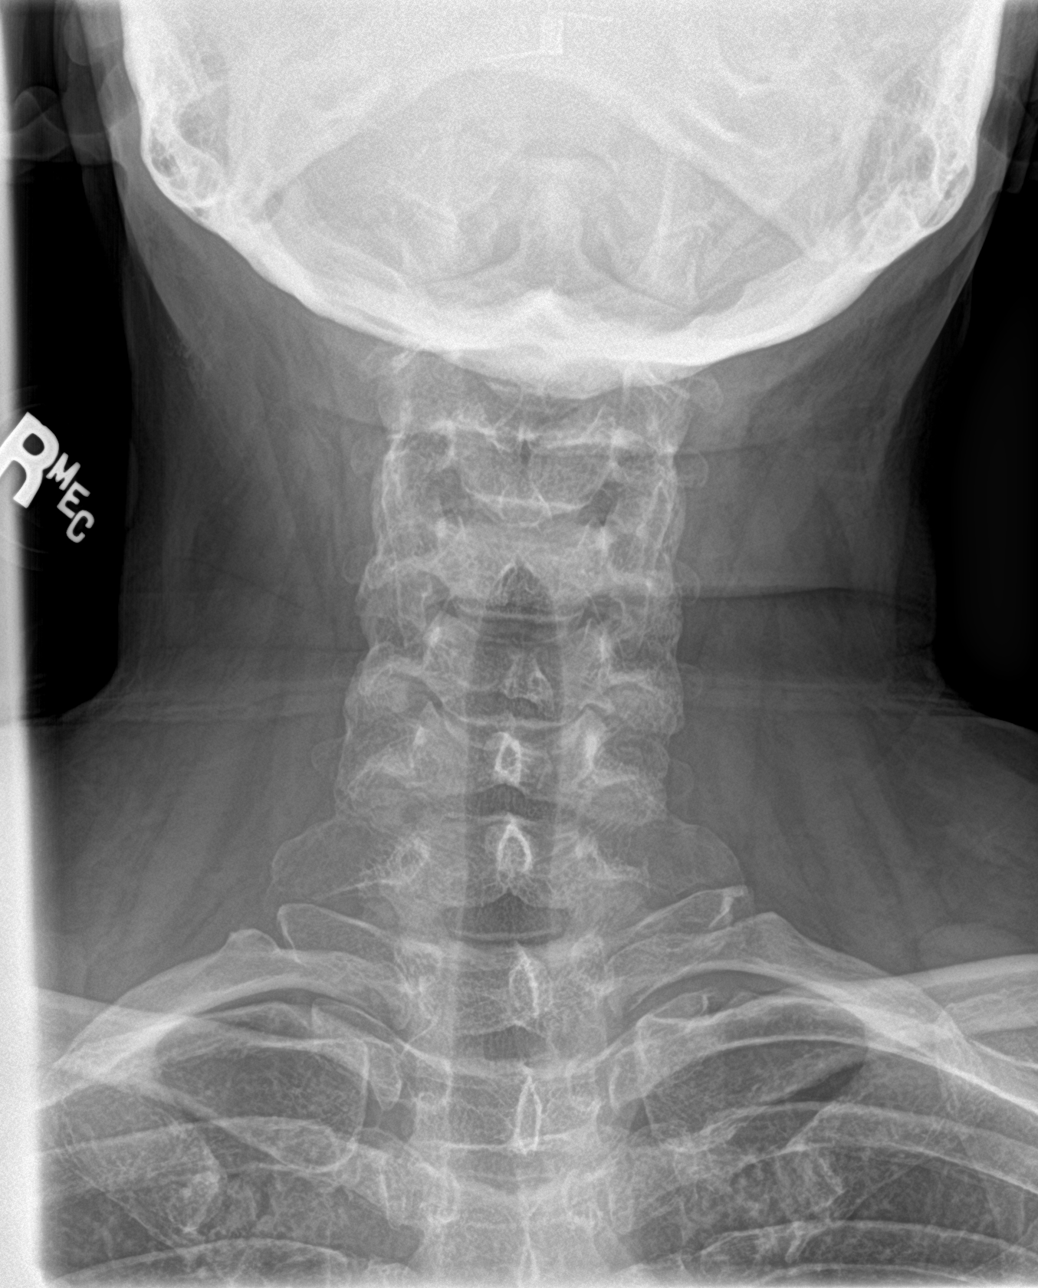
[im 2/4]
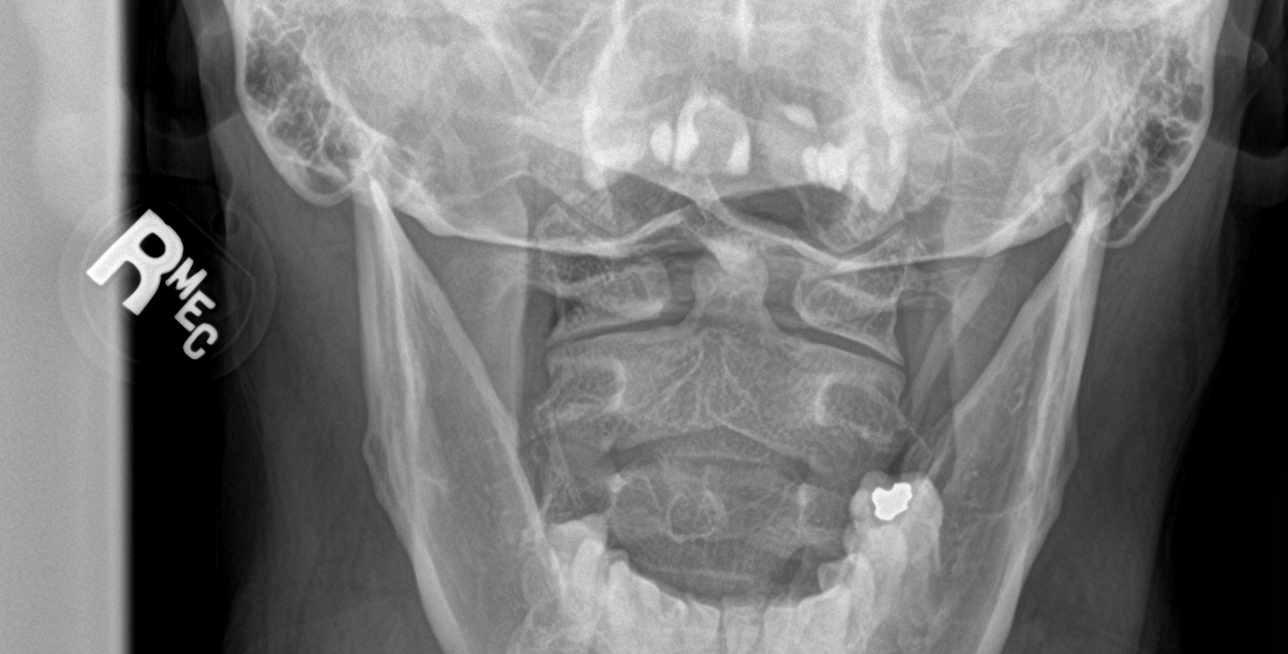
[im 3/4]
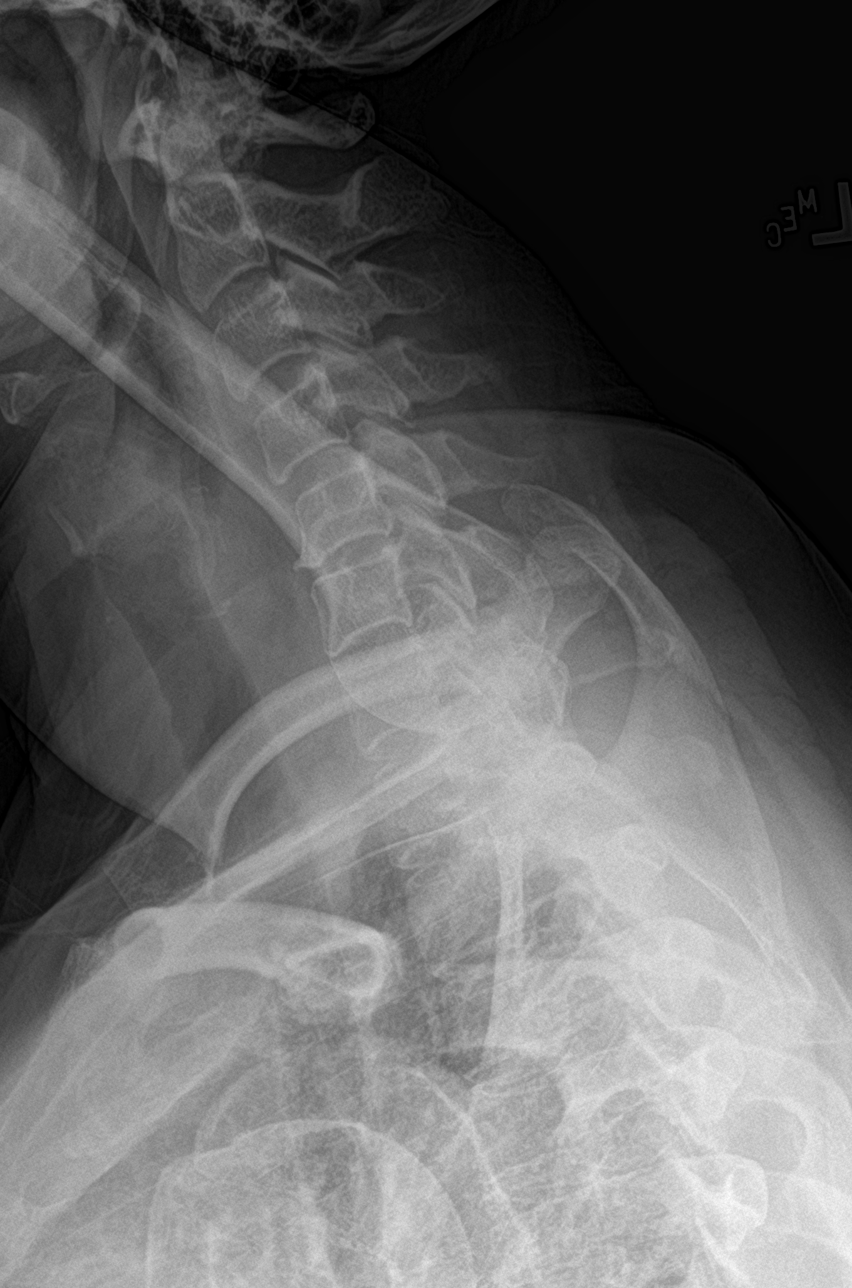
[im 4/4]
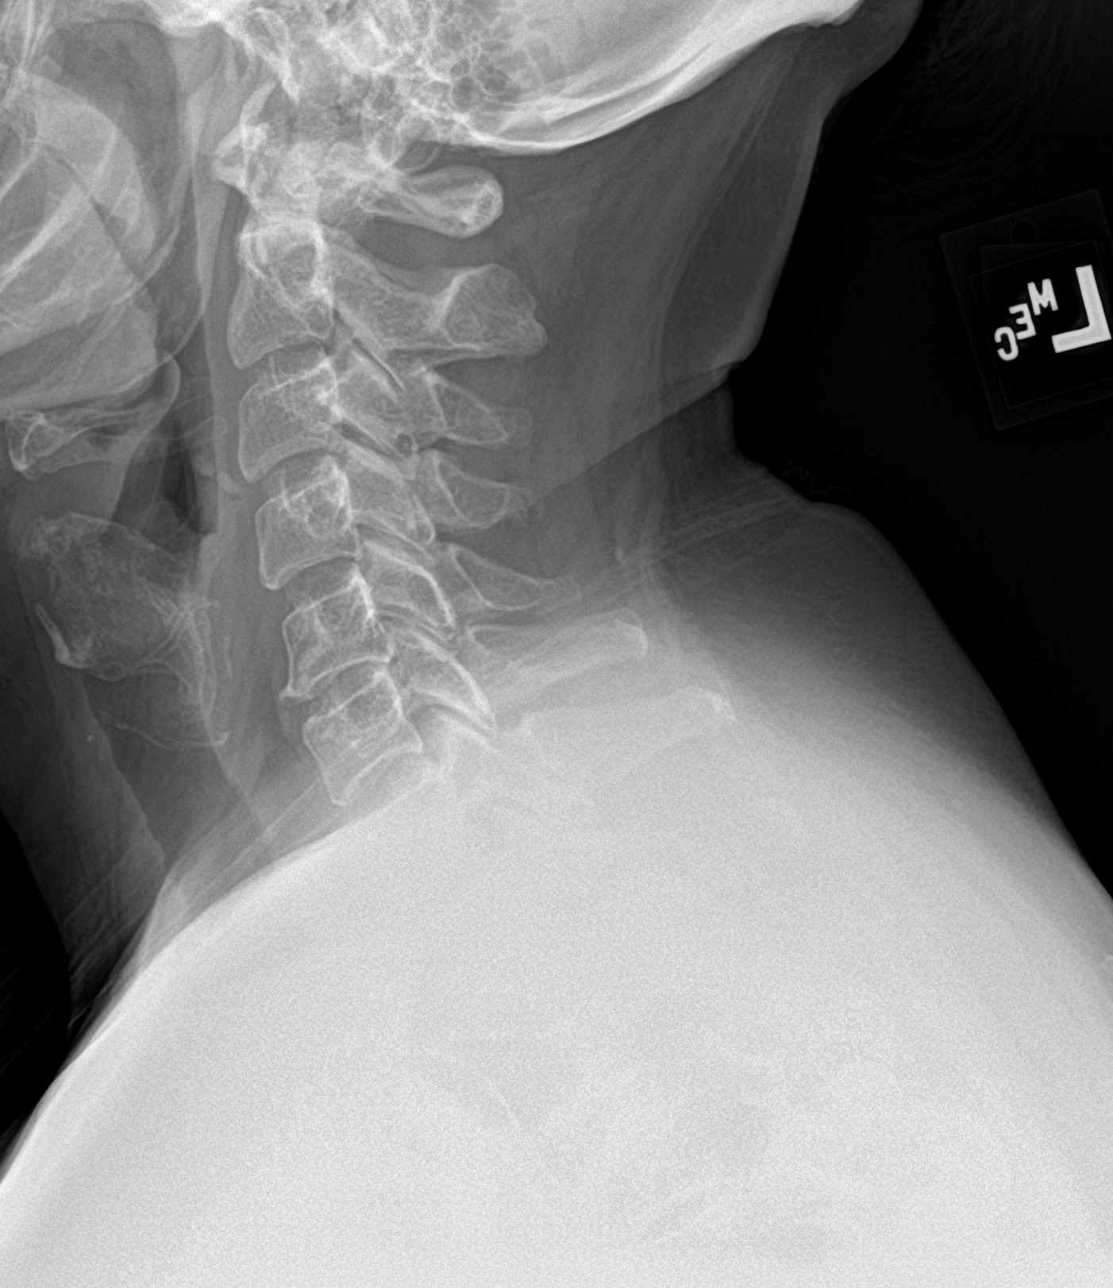

[4 of 4 positions shown; findings below may reference images not displayed]

FINDINGS: Loss of normal cervical lordosis. Mild disc degeneration C5-C6 with
mild endplate osteophyte formation. No acute bony abnormality
identified. No evidence of fracture.
IMPRESSION: Mild degenerative changes C5-C6.  No acute abnormality.

## 2022-04-17 ENCOUNTER — Emergency Department
Admission: EM | Admit: 2022-04-17 | Discharge: 2022-04-17 | Disposition: A | Discharge status: Home / Self Care | Payer: Medicaid Other | Attending: Emergency Medicine | Admitting: Emergency Medicine

## 2022-04-17 ENCOUNTER — Encounter: Payer: Self-pay | Admitting: Emergency Medicine

## 2022-04-17 ENCOUNTER — Emergency Department: Payer: Medicaid Other

## 2022-04-17 DIAGNOSIS — J069 Acute upper respiratory infection, unspecified: Secondary | ICD-10-CM | POA: Insufficient documentation

## 2022-04-17 DIAGNOSIS — Z20822 Contact with and (suspected) exposure to covid-19: Secondary | ICD-10-CM | POA: Diagnosis not present

## 2022-04-17 DIAGNOSIS — R0789 Other chest pain: Secondary | ICD-10-CM

## 2022-04-17 DIAGNOSIS — G43809 Other migraine, not intractable, without status migrainosus: Secondary | ICD-10-CM | POA: Insufficient documentation

## 2022-04-17 DIAGNOSIS — R079 Chest pain, unspecified: Secondary | ICD-10-CM | POA: Diagnosis present

## 2022-04-17 LAB — CBC
HCT: 41.2 % (ref 36.0–46.0)
Hemoglobin: 14.5 g/dL (ref 12.0–15.0)
MCH: 36.6 pg — ABNORMAL HIGH (ref 26.0–34.0)
MCHC: 35.2 g/dL (ref 30.0–36.0)
MCV: 104 fL — ABNORMAL HIGH (ref 80.0–100.0)
Platelets: 217 10*3/uL (ref 150–400)
RBC: 3.96 MIL/uL (ref 3.87–5.11)
RDW: 12.9 % (ref 11.5–15.5)
WBC: 6.7 10*3/uL (ref 4.0–10.5)
nRBC: 0 % (ref 0.0–0.2)

## 2022-04-17 LAB — RESP PANEL BY RT-PCR (RSV, FLU A&B, COVID)  RVPGX2
Influenza A by PCR: NEGATIVE
Influenza B by PCR: NEGATIVE
Resp Syncytial Virus by PCR: NEGATIVE
SARS Coronavirus 2 by RT PCR: NEGATIVE

## 2022-04-17 LAB — BASIC METABOLIC PANEL
Anion gap: 8 (ref 5–15)
BUN: 8 mg/dL (ref 6–20)
CO2: 21 mmol/L — ABNORMAL LOW (ref 22–32)
Calcium: 8.7 mg/dL — ABNORMAL LOW (ref 8.9–10.3)
Chloride: 105 mmol/L (ref 98–111)
Creatinine, Ser: 0.61 mg/dL (ref 0.44–1.00)
GFR, Estimated: >60 mL/min (ref >60)
Glucose, Bld: 101 mg/dL — ABNORMAL HIGH (ref 70–99)
Potassium: 3.3 mmol/L — ABNORMAL LOW (ref 3.5–5.1)
Sodium: 134 mmol/L — ABNORMAL LOW (ref 135–145)

## 2022-04-17 LAB — HEPATIC FUNCTION PANEL
ALT: 18 U/L (ref 0–44)
AST: 24 U/L (ref 15–41)
Albumin: 3.9 g/dL (ref 3.5–5.0)
Alkaline Phosphatase: 78 U/L (ref 38–126)
Bilirubin, Direct: 0.1 mg/dL (ref 0.0–0.2)
Indirect Bilirubin: 0.7 mg/dL (ref 0.3–0.9)
Total Bilirubin: 0.8 mg/dL (ref 0.3–1.2)
Total Protein: 7.5 g/dL (ref 6.5–8.1)

## 2022-04-17 LAB — TROPONIN I (HIGH SENSITIVITY)
Troponin I (High Sensitivity): 3 ng/L (ref <18)
Troponin I (High Sensitivity): <2 ng/L (ref <18)

## 2022-04-17 LAB — LIPASE, BLOOD: Lipase: 28 U/L (ref 11–51)

## 2022-04-17 MED ORDER — HALOPERIDOL LACTATE 5 MG/ML IJ SOLN
2.5000 mg | Freq: Once | INTRAMUSCULAR | Status: AC
  Administered 2022-04-17: 2.5 mg via INTRAVENOUS
  Filled 2022-04-17: qty 1

## 2022-04-17 MED ORDER — ALBUTEROL SULFATE HFA 108 (90 BASE) MCG/ACT IN AERS: 2.0000 | INHALATION_SPRAY | Freq: Four times a day (QID) | RESPIRATORY_TRACT | 0 refills | Status: AC | PRN

## 2022-04-17 MED ORDER — SODIUM CHLORIDE 0.9 % IV BOLUS
1000.0000 mL | Freq: Once | INTRAVENOUS | Status: AC
  Administered 2022-04-17: 1000 mL via INTRAVENOUS

## 2022-04-17 MED ORDER — IPRATROPIUM-ALBUTEROL 0.5-2.5 (3) MG/3ML IN SOLN
3.0000 mL | Freq: Once | RESPIRATORY_TRACT | Status: AC
  Administered 2022-04-17: 3 mL via RESPIRATORY_TRACT
  Filled 2022-04-17: qty 3

## 2022-04-17 MED ORDER — LACTATED RINGERS IV BOLUS
1000.0000 mL | Freq: Once | INTRAVENOUS | Status: AC
  Administered 2022-04-17: 1000 mL via INTRAVENOUS

## 2022-04-17 MED ORDER — PREDNISONE 20 MG PO TABS: 40.0000 mg | ORAL_TABLET | Freq: Every day | ORAL | 0 refills | Status: AC

## 2022-04-17 MED ORDER — METOCLOPRAMIDE HCL 5 MG/ML IJ SOLN
10.0000 mg | Freq: Once | INTRAMUSCULAR | Status: AC
  Administered 2022-04-17: 10 mg via INTRAVENOUS
  Filled 2022-04-17: qty 2

## 2022-04-17 MED ORDER — KETOROLAC TROMETHAMINE 30 MG/ML IJ SOLN
15.0000 mg | Freq: Once | INTRAMUSCULAR | Status: AC
  Administered 2022-04-17: 15 mg via INTRAVENOUS
  Filled 2022-04-17: qty 1

## 2022-04-17 MED ORDER — DOXYCYCLINE HYCLATE 100 MG PO CAPS: 100.0000 mg | ORAL_CAPSULE | Freq: Two times a day (BID) | ORAL | 0 refills | Status: AC

## 2022-04-17 MED ORDER — DIPHENHYDRAMINE HCL 50 MG/ML IJ SOLN
25.0000 mg | Freq: Once | INTRAMUSCULAR | Status: AC
  Administered 2022-04-17: 25 mg via INTRAVENOUS
  Filled 2022-04-17: qty 1

## 2022-04-17 MED ORDER — DEXAMETHASONE SODIUM PHOSPHATE 10 MG/ML IJ SOLN
10.0000 mg | Freq: Once | INTRAMUSCULAR | Status: AC
  Administered 2022-04-17: 10 mg via INTRAVENOUS
  Filled 2022-04-17: qty 1

## 2022-04-17 MED ORDER — HYDROCODONE BIT-HOMATROP MBR 5-1.5 MG/5ML PO SOLN: 5.0000 mL | Freq: Four times a day (QID) | ORAL | 0 refills | Status: DC | PRN

## 2022-04-17 NOTE — ED Notes (Signed)
Pt states HA has improved, now 6/10. Pt has removed all VS monitoring equip.

## 2022-04-17 NOTE — ED Triage Notes (Signed)
Pt reports CP x2 hours. Also having a headache since 3 yesterday. Denies any sick contacts. Hx of cardiac cath in 2020 with no interventions.

## 2022-04-17 NOTE — ED Notes (Signed)
EDP at bedside  

## 2022-04-17 NOTE — ED Notes (Signed)
Pt requested meds for HA. See new orders.

## 2022-04-17 NOTE — ED Provider Notes (Signed)
[facility] Provider Note    Event Date/Time   First MD Initiated Contact with Patient 04/17/22 [phone]     (approximate)   History   Chest Pain   HPI  Katelyn Haynes is a 47 y.o. female  here with chest pain, cough, congestion. Pt reports that over the past several days she has had cough, congestion, and mild wheezing. She had some right sided lower chest and shoulder blade pain yesterday with mild nausea. She then developed left sided chest pain today that is aching, throbbing, and constant. H/o chronic bronchitis. No fevers but has had some chills and body aches. No diarrhea.       Physical Exam   Triage Vital Signs: ED Triage Vitals  Enc Vitals Group     BP 04/17/22 0741 (!) 125/91     Pulse Rate 04/17/22 0741 81     Resp 04/17/22 0741 18     Temp 04/17/22 0741 98.6 F (37 C)     Temp Source 04/17/22 0741 Oral     SpO2 04/17/22 0741 97 %     Weight 04/17/22 0737 191 lb 12.8 oz (87 kg)     Height 04/17/22 0737 5' 4"$  (1.626 m)     Head Circumference --      Peak Flow --      Pain Score 04/17/22 0736 8     Pain Loc --      Pain Edu? --      Excl. in [place]? --     Most recent vital signs: Vitals:   04/17/22 0900 04/17/22 0945  BP: 91/65   Pulse: 91 (!) 105  Resp: 20 20  Temp:    SpO2: 96% 96%     General: Awake, no distress.  CV:  Good peripheral perfusion. RRR. Resp:  Normal work of breathing. Lungs with slight expiratory wheezes bilaterally. Abd:  No distention. Mild RUQ TTP, no guarding. Other:  No LE edema.   ED Results / Procedures / Treatments   Labs (all labs ordered are listed, but only abnormal results are displayed) Labs Reviewed  BASIC METABOLIC PANEL - Abnormal; Notable for the following components:      Result Value   Sodium 134 (*)    Potassium 3.3 (*)    CO2 21 (*)    Glucose, Bld 101 (*)    Calcium 8.7 (*)    All other components within normal limits  CBC - Abnormal; Notable for the following components:   MCV  104.0 (*)    MCH 36.6 (*)    All other components within normal limits  RESP PANEL BY RT-PCR (RSV, FLU A&B, COVID)  [id number]  HEPATIC FUNCTION PANEL  LIPASE, BLOOD  TROPONIN I (HIGH SENSITIVITY)  TROPONIN I (HIGH SENSITIVITY)     EKG Normal sinus rhythm, VR 75. PR 144, QRS 88, QTc 435. No acute ST elevations or depressions. No ischemia or infarct.   RADIOLOGY CXR: Clear RUQ [place]: Negative, gallbladder polyp incidentally noted   I also independently reviewed and agree with radiologist interpretations.   PROCEDURES:  Critical Care performed: No  .1-3 Lead EKG Interpretation  Performed by: Duffy Bruce, MD Authorized by: Duffy Bruce, MD     Interpretation: normal     ECG rate:  90-110   ECG rate assessment: normal     Rhythm: sinus rhythm     Ectopy: none     Conduction: normal   Comments:     Indication: chest pain  MEDICATIONS ORDERED IN ED: Medications  ipratropium-albuterol (DUONEB) 0.5-2.5 (3) MG/3ML nebulizer solution 3 mL (3 mLs Nebulization Given 04/17/22 0844)  ipratropium-albuterol (DUONEB) 0.5-2.5 (3) MG/3ML nebulizer solution 3 mL (3 mLs Nebulization Given 04/17/22 0843)  diphenhydrAMINE (BENADRYL) injection 25 mg (25 mg Intravenous Given 04/17/22 0841)  metoCLOPramide (REGLAN) injection 10 mg (10 mg Intravenous Given 04/17/22 0839)  dexamethasone (DECADRON) injection 10 mg (10 mg Intravenous Given 04/17/22 0842)  lactated ringers bolus 1,000 mL (0 mLs Intravenous Stopped 04/17/22 1019)  ipratropium-albuterol (DUONEB) 0.5-2.5 (3) MG/3ML nebulizer solution 3 mL (3 mLs Nebulization Given 04/17/22 1019)  haloperidol lactate (HALDOL) injection 2.5 mg (2.5 mg Intravenous Given 04/17/22 1019)  ketorolac (TORADOL) 30 MG/ML injection 15 mg (15 mg Intravenous Given 04/17/22 1016)  sodium chloride 0.9 % bolus 1,000 mL (0 mLs Intravenous Stopped 04/17/22 1156)  diphenhydrAMINE (BENADRYL) injection 25 mg (25 mg Intravenous Given 04/17/22 1109)     IMPRESSION / MDM /  ASSESSMENT AND PLAN / ED COURSE  I reviewed the triage vital signs and the nursing notes.                              Differential diagnosis includes, but is not limited to, URI, bronchitis, CAP, chest wall pain, ACS, migraine, tension headache  Patient's presentation is most consistent with acute presentation with potential threat to life or bodily function.  The patient is on the cardiac monitor to evaluate for evidence of arrhythmia and/or significant heart rate changes  47 yo F here with chest pain, cough, SOB, abdominal pain, nausea, wheezing. Suspect viral URI or early bronchitis or CAP. No hypoxia or signs of significnt resp distress. EKG is nonischemic and troponins negative, doubt ACS. COVID negative. BMP unremarkable. CXR reviewed and is cllear though exam does show course bs with some wheezing.   Pt also had nausea, mild RUQ TTP so US obtaine dand is negative. No significant LFT abnormality or renal dysfunction. EKG non ischemic and trop neg x 2. No pleurisy or signs to suggest PE.  Will treat for likely URI/infectious bronchitis with component of underlying RAD/wheezing. Return precautions given.  FINAL CLINICAL IMPRESSION(S) / ED DIAGNOSES   Final diagnoses:  Atypical chest pain  Upper respiratory tract infection, unspecified type  Other migraine without status migrainosus, not intractable     Rx / DC Orders   ED Discharge Orders          Ordered    predniSONE (DELTASONE) 20 MG tablet  Daily        04/17/22 1141    doxycycline (VIBRAMYCIN) 100 MG capsule  2 times daily        04/17/22 1141    albuterol (VENTOLIN HFA) 108 (90 Base) MCG/ACT inhaler  Every 6 hours PRN        04/17/22 1141    HYDROcodone bit-homatropine (HYCODAN) 5-1.5 MG/5ML syrup  Every 6 hours PRN        04/17/22 1141             Note:  This document was prepared using Dragon voice recognition software and may include unintentional dictation errors.   Duffy Bruce, MD 04/17/22  [phone]

## 2022-04-17 NOTE — ED Notes (Signed)
Pt to xray and back. Pt states had recent sinus infx and cough, then this morning developed L chest pressure radiating into neck and also HA. Skin warm and dry. Pt has wet cough.

## 2022-04-17 NOTE — ED Notes (Signed)
EDP will talk with pt. Pt appears anxious and was out of bed and had removed monitoring equipment. Husband at bedside.

## 2022-04-29 ENCOUNTER — Other Ambulatory Visit: Payer: Self-pay | Admitting: Orthopedic Surgery

## 2022-04-29 DIAGNOSIS — M25561 Pain in right knee: Secondary | ICD-10-CM

## 2022-04-29 DIAGNOSIS — S8991XA Unspecified injury of right lower leg, initial encounter: Secondary | ICD-10-CM

## 2022-05-01 ENCOUNTER — Ambulatory Visit
Admission: RE | Admit: 2022-05-01 | Discharge: 2022-05-01 | Disposition: A | Discharge status: Home / Self Care | Payer: 59 | Source: Ambulatory Visit | Attending: Orthopedic Surgery | Admitting: Orthopedic Surgery

## 2022-05-01 DIAGNOSIS — M25561 Pain in right knee: Secondary | ICD-10-CM

## 2022-05-01 DIAGNOSIS — S8991XA Unspecified injury of right lower leg, initial encounter: Secondary | ICD-10-CM

## 2022-07-16 DIAGNOSIS — J988 Other specified respiratory disorders: Secondary | ICD-10-CM | POA: Insufficient documentation

## 2022-08-26 ENCOUNTER — Encounter: Payer: Self-pay | Admitting: Emergency Medicine

## 2022-08-26 ENCOUNTER — Emergency Department: Payer: 59

## 2022-08-26 ENCOUNTER — Emergency Department
Admission: EM | Admit: 2022-08-26 | Discharge: 2022-08-26 | Disposition: A | Discharge status: Home / Self Care | Payer: 59 | Attending: Emergency Medicine | Admitting: Emergency Medicine

## 2022-08-26 ENCOUNTER — Other Ambulatory Visit: Payer: Self-pay

## 2022-08-26 DIAGNOSIS — R079 Chest pain, unspecified: Secondary | ICD-10-CM | POA: Insufficient documentation

## 2022-08-26 DIAGNOSIS — R519 Headache, unspecified: Secondary | ICD-10-CM | POA: Diagnosis not present

## 2022-08-26 LAB — BASIC METABOLIC PANEL
Anion gap: 8 (ref 5–15)
BUN: 10 mg/dL (ref 6–20)
CO2: 21 mmol/L — ABNORMAL LOW (ref 22–32)
Calcium: 9.1 mg/dL (ref 8.9–10.3)
Chloride: 109 mmol/L (ref 98–111)
Creatinine, Ser: 0.69 mg/dL (ref 0.44–1.00)
GFR, Estimated: >60 mL/min (ref >60)
Glucose, Bld: 102 mg/dL — ABNORMAL HIGH (ref 70–99)
Potassium: 3.1 mmol/L — ABNORMAL LOW (ref 3.5–5.1)
Sodium: 138 mmol/L (ref 135–145)

## 2022-08-26 LAB — CBC WITH DIFFERENTIAL/PLATELET
Abs Immature Granulocytes: 0.05 10*3/uL (ref 0.00–0.07)
Basophils Absolute: 0 10*3/uL (ref 0.0–0.1)
Basophils Relative: 0 %
Eosinophils Absolute: 0.2 10*3/uL (ref 0.0–0.5)
Eosinophils Relative: 3 %
HCT: 39 % (ref 36.0–46.0)
Hemoglobin: 14 g/dL (ref 12.0–15.0)
Immature Granulocytes: 1 %
Lymphocytes Relative: 43 %
Lymphs Abs: 3.8 10*3/uL (ref 0.7–4.0)
MCH: 35.7 pg — ABNORMAL HIGH (ref 26.0–34.0)
MCHC: 35.9 g/dL (ref 30.0–36.0)
MCV: 99.5 fL (ref 80.0–100.0)
Monocytes Absolute: 0.6 10*3/uL (ref 0.1–1.0)
Monocytes Relative: 7 %
Neutro Abs: 4.1 10*3/uL (ref 1.7–7.7)
Neutrophils Relative %: 46 %
Platelets: 198 10*3/uL (ref 150–400)
RBC: 3.92 MIL/uL (ref 3.87–5.11)
RDW: 11.9 % (ref 11.5–15.5)
WBC: 8.9 10*3/uL (ref 4.0–10.5)
nRBC: 0 % (ref 0.0–0.2)

## 2022-08-26 LAB — D-DIMER, QUANTITATIVE: D-Dimer, Quant: 0.73 ug/mL-FEU — ABNORMAL HIGH (ref 0.00–0.50)

## 2022-08-26 LAB — TROPONIN I (HIGH SENSITIVITY)
Troponin I (High Sensitivity): 3 ng/L (ref <18)
Troponin I (High Sensitivity): 3 ng/L (ref <18)

## 2022-08-26 MED ORDER — KETOROLAC TROMETHAMINE 30 MG/ML IJ SOLN
15.0000 mg | Freq: Once | INTRAMUSCULAR | Status: AC
  Administered 2022-08-26: 15 mg via INTRAVENOUS
  Filled 2022-08-26: qty 1

## 2022-08-26 MED ORDER — IOHEXOL 350 MG/ML SOLN
80.0000 mL | Freq: Once | INTRAVENOUS | Status: AC | PRN
  Administered 2022-08-26: 80 mL via INTRAVENOUS

## 2022-08-26 MED ORDER — DIPHENHYDRAMINE HCL 25 MG PO CAPS
50.0000 mg | ORAL_CAPSULE | Freq: Once | ORAL | Status: AC
  Administered 2022-08-26: 50 mg via ORAL
  Filled 2022-08-26: qty 2

## 2022-08-26 MED ORDER — METOCLOPRAMIDE HCL 5 MG/ML IJ SOLN
10.0000 mg | Freq: Once | INTRAMUSCULAR | Status: AC
  Administered 2022-08-26: 10 mg via INTRAVENOUS
  Filled 2022-08-26: qty 2

## 2022-08-26 MED ORDER — METHYLPREDNISOLONE SODIUM SUCC 40 MG IJ SOLR
40.0000 mg | Freq: Once | INTRAMUSCULAR | Status: AC
  Administered 2022-08-26: 40 mg via INTRAVENOUS
  Filled 2022-08-26: qty 1

## 2022-08-26 MED ORDER — DIPHENHYDRAMINE HCL 50 MG/ML IJ SOLN
50.0000 mg | Freq: Once | INTRAMUSCULAR | Status: AC
  Filled 2022-08-26: qty 1

## 2022-08-26 MED ORDER — MORPHINE SULFATE (PF) 4 MG/ML IV SOLN
4.0000 mg | Freq: Once | INTRAVENOUS | Status: AC
  Administered 2022-08-26: 4 mg via INTRAVENOUS
  Filled 2022-08-26: qty 1

## 2022-08-26 NOTE — ED Provider Notes (Signed)
[facility] Provider Note    Event Date/Time   First MD Initiated Contact with Patient 08/26/22 1752     (approximate)   History   Chief Complaint Chest Pain   HPI  Katelyn Haynes is a 47 y.o. female with past medical history of chronic bronchitis who presents to the ED complaining of chest pain.  Patient reports that she has been dealing with constant pain in the left side of her chest since yesterday which she describes as an aching.  She states that the pain has seem to get worse over the course of the day today and associated with numbness in her left arm and left leg.  She also began having a headache about an hour prior to arrival, denies any difficulty breathing.  She denies any history of similar symptoms.     Physical Exam   Triage Vital Signs: ED Triage Vitals  Enc Vitals Group     BP      Pulse      Resp      Temp      Temp src      SpO2      Weight      Height      Head Circumference      Peak Flow      Pain Score      Pain Loc      Pain Edu?      Excl. in [place]?     Most recent vital signs: Vitals:   08/26/22 2200 08/26/22 2300  BP: 131/74 (!) 146/88  Pulse:  74  Resp: 15 (!) 24  Temp:    SpO2:      Constitutional: Alert and oriented. Eyes: Conjunctivae are normal. Head: Atraumatic. Nose: No congestion/rhinnorhea. Mouth/Throat: Mucous membranes are moist.  Cardiovascular: Normal rate, regular rhythm. Grossly normal heart sounds.  2+ radial and DP pulses bilaterally. Respiratory: Normal respiratory effort.  No retractions. Lungs CTAB. Gastrointestinal: Soft and nontender. No distention. Musculoskeletal: No lower extremity tenderness nor edema.  Neurologic:  Normal speech and language. No gross focal neurologic deficits are appreciated.    ED Results / Procedures / Treatments   Labs (all labs ordered are listed, but only abnormal results are displayed) Labs Reviewed  CBC WITH DIFFERENTIAL/PLATELET - Abnormal; Notable  for the following components:      Result Value   MCH 35.7 (*)    All other components within normal limits  BASIC METABOLIC PANEL - Abnormal; Notable for the following components:   Potassium 3.1 (*)    CO2 21 (*)    Glucose, Bld 102 (*)    All other components within normal limits  D-DIMER, QUANTITATIVE - Abnormal; Notable for the following components:   D-Dimer, Quant 0.73 (*)    All other components within normal limits  TROPONIN I (HIGH SENSITIVITY)  TROPONIN I (HIGH SENSITIVITY)     EKG  ED ECG REPORT I, Chesley Noon, the attending physician, personally viewed and interpreted this ECG.   Date: 08/26/2022  EKG Time: 17:57  Rate: 77  Rhythm: normal sinus rhythm  Axis: Normal  Intervals:none  ST&T Change: None  RADIOLOGY CT head reviewed and interpreted by me with no hemorrhage or midline shift.  PROCEDURES:  Critical Care performed: No  Procedures   MEDICATIONS ORDERED IN ED: Medications  ketorolac (TORADOL) 30 MG/ML injection 15 mg (has no administration in time range)  methylPREDNISolone sodium succinate (SOLU-MEDROL) 40 mg/mL injection 40 mg (40 mg Intravenous Given  08/26/22 1852)  diphenhydrAMINE (BENADRYL) capsule 50 mg (50 mg Oral Given 08/26/22 2152)    Or  diphenhydrAMINE (BENADRYL) injection 50 mg ( Intravenous See Alternative 08/26/22 2152)  metoCLOPramide (REGLAN) injection 10 mg (10 mg Intravenous Given 08/26/22 1913)  morphine (PF) 4 MG/ML injection 4 mg (4 mg Intravenous Given 08/26/22 2018)  iohexol (OMNIPAQUE) 350 MG/ML injection 80 mL (80 mLs Intravenous Contrast Given 08/26/22 2235)     IMPRESSION / MDM / ASSESSMENT AND PLAN / ED COURSE  I reviewed the triage vital signs and the nursing notes.                              47 y.o. female with past medical history of chronic bronchitis who presents to the ED with worsening aching pain in the left side of her chest since yesterday, today associated with headache and left-sided numbness.  Patient's  presentation is most consistent with acute presentation with potential threat to life or bodily function.  Differential diagnosis includes, but is not limited to, ACS, PE, dissection, pneumonia, pneumothorax, musculoskeletal pain, anxiety, GERD, SAH, stroke, anemia, electrolyte abnormality.  Patient nontoxic-appearing and in no acute distress, vital signs are unremarkable.  Symptoms seem atypical for ACS and EKG shows no evidence of arrhythmia or ischemia.  She reports numbness in her left arm and left leg but has no focal weakness on exam, has strong pulses in all 4 extremities.  Given sudden onset of headache, will check CT head for evidence of SAH or other intracranial process.  Dissection also considered, but seems less likely with gradually worsening pain since yesterday and reassuring vital signs.  We will check D-dimer given patient's low aortic dissection detection risk score.  Additional labs and chest x-ray are also pending at this time.  CT head is negative for acute process, given onset of headache within 6 hours of imaging, low suspicion for [facility].  Chest x-ray is unremarkable, 2 sets of troponin within normal limits and I doubt ACS.  D-dimer noted to be elevated, patient received pretreatment for contrast allergy prior to obtaining CTA.  This was negative for dissection or other acute process.  Patient's pain reproducible with palpation of her chest and I suspect musculoskeletal etiology.  She is appropriate for discharge home with PCP follow-up, was counseled to return to the ED for new or worsening symptoms.  Patient agrees with plan.      FINAL CLINICAL IMPRESSION(S) / ED DIAGNOSES   Final diagnoses:  Nonspecific chest pain  Acute nonintractable headache, unspecified headache type     Rx / DC Orders   ED Discharge Orders     None        Note:  This document was prepared using Dragon voice recognition software and may include unintentional dictation errors.   Chesley Noon, MD 08/26/22 2322

## 2022-08-26 NOTE — ED Notes (Signed)
Katelyn Buttery, MD at bedside assessing patient. No code stroke at this time

## 2022-08-26 NOTE — ED Triage Notes (Signed)
Patient to ED via Pov for left sided tinging and weakness. LWK [id number]. States difficulty swallowing and headache. Also having chest pain that started yesterday.

## 2022-08-27 LAB — CBG MONITORING, ED: Glucose-Capillary: 104 mg/dL — ABNORMAL HIGH (ref 70–99)

## 2022-09-12 DIAGNOSIS — E785 Hyperlipidemia, unspecified: Secondary | ICD-10-CM | POA: Insufficient documentation

## 2022-09-12 DIAGNOSIS — J45909 Unspecified asthma, uncomplicated: Secondary | ICD-10-CM | POA: Insufficient documentation

## 2022-09-12 DIAGNOSIS — I700 Atherosclerosis of aorta: Secondary | ICD-10-CM | POA: Insufficient documentation

## 2022-11-25 DIAGNOSIS — M79641 Pain in right hand: Secondary | ICD-10-CM | POA: Insufficient documentation

## 2023-01-14 DIAGNOSIS — S67192A Crushing injury of right middle finger, initial encounter: Secondary | ICD-10-CM | POA: Insufficient documentation

## 2023-01-20 ENCOUNTER — Ambulatory Visit (HOSPITAL_BASED_OUTPATIENT_CLINIC_OR_DEPARTMENT_OTHER): Payer: 59 | Admitting: Pain Medicine

## 2023-01-20 ENCOUNTER — Ambulatory Visit
Admission: RE | Admit: 2023-01-20 | Discharge: 2023-01-20 | Disposition: A | Discharge status: Home / Self Care | Payer: 59 | Source: Ambulatory Visit | Attending: Pain Medicine | Admitting: Pain Medicine

## 2023-01-20 ENCOUNTER — Encounter: Payer: Self-pay | Admitting: Pain Medicine

## 2023-01-20 VITALS — BP 143/75 | HR 72 | Temp 97.7°F | Resp 16 | Ht 60.0 in | Wt 200.0 lb

## 2023-01-20 DIAGNOSIS — M899 Disorder of bone, unspecified: Secondary | ICD-10-CM | POA: Insufficient documentation

## 2023-01-20 DIAGNOSIS — G8929 Other chronic pain: Secondary | ICD-10-CM | POA: Insufficient documentation

## 2023-01-20 DIAGNOSIS — Z789 Other specified health status: Secondary | ICD-10-CM | POA: Insufficient documentation

## 2023-01-20 DIAGNOSIS — G4486 Cervicogenic headache: Secondary | ICD-10-CM | POA: Insufficient documentation

## 2023-01-20 DIAGNOSIS — M549 Dorsalgia, unspecified: Secondary | ICD-10-CM | POA: Insufficient documentation

## 2023-01-20 DIAGNOSIS — M545 Low back pain, unspecified: Secondary | ICD-10-CM | POA: Insufficient documentation

## 2023-01-20 DIAGNOSIS — M542 Cervicalgia: Secondary | ICD-10-CM | POA: Insufficient documentation

## 2023-01-20 DIAGNOSIS — Z79899 Other long term (current) drug therapy: Secondary | ICD-10-CM | POA: Insufficient documentation

## 2023-01-20 DIAGNOSIS — G894 Chronic pain syndrome: Secondary | ICD-10-CM | POA: Insufficient documentation

## 2023-01-20 DIAGNOSIS — M5459 Other low back pain: Secondary | ICD-10-CM | POA: Insufficient documentation

## 2023-01-20 NOTE — Progress Notes (Signed)
Safety precautions to be maintained throughout the outpatient stay will include: orient to surroundings, keep bed in low position, maintain call bell within reach at all times, provide assistance with transfer out of bed and ambulation.  

## 2023-01-20 NOTE — Progress Notes (Signed)
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 recent imaging of the cervical spine. We discussed the need for x-rays to assess instability. We will order cervical spine x-rays with flexion and extension views to assess for instability.  Migraines   She suffers from chronic migraines described as a vice-like pain around the head, starting from the back and moving to the front, associated with nausea, vomiting, photophobia, and phonophobia, with no recent imaging of the head or neck. We will schedule a follow-up appointment in three weeks to review results.       Lab Orders         Compliance Drug Analysis, Ur         Comp. Metabolic Panel (12)         Magnesium         Vitamin B12         Sedimentation rate         25-Hydroxy vitamin D Lcms D2+D3         C-reactive protein     Imaging Orders  DG Cervical Spine With Flex & Extend         DG Lumbar Spine Complete W/Bend      Referral Orders  No referral(s) requested today   Procedure Orders    No procedure(s) ordered today   Pharmacotherapy (current): Medications ordered:  No orders of the defined types were placed in this encounter.  Medications administered during this visit: Val A. Leeper had no medications administered during this visit.   Analgesic Pharmacotherapy:  Opioid Analgesics: For patients currently taking or requesting to take opioid analgesics, in accordance with [facility] Guidelines, we will assess their risks and indications for the use of these substances. After completing our evaluation, we may offer recommendations, but we no longer take patients for medication management. The prescribing physician will ultimately decide, based on his/her training and level of comfort whether to adopt any of the recommendations, including whether or not to prescribe such medicines.  Membrane stabilizer: To be determined at a later time  Muscle relaxant: To be determined at a later time  NSAID: To be determined at a later time  Other analgesic(s): To be determined at a later time   Interventional management options: Katelyn Haynes was informed that there is no guarantee that she would be a candidate for interventional therapies. The decision will be based on the results of diagnostic studies, as well as Katelyn Haynes's risk profile.  Procedure(s) under consideration:  Pending results of ordered studies      Interventional Therapies  Risk Factors  Considerations  Medical Comorbidities:     Planned  Pending:      Under consideration:   Diagnostic bilateral lumbar facet block #1    Completed:   None at this time   Therapeutic  Palliative (PRN) options:   None established   Completed by other providers:   Physical therapy  Diagnostic bilateral lumbar facet blocks  Therapeutic lumbar facet RFA       Provider-requested follow-up: Return in about 3 weeks (around 02/10/2023)  for ( ), Eval-day (M,W), (F2F), 2nd Visit, for review of ordered tests.  No future appointments.   Duration of encounter: 45 minutes.  Total time on encounter, as per AMA guidelines included both the face-to-face and non-face-to-face time personally spent by the physician and/or other qualified health care professional(s) on the day of the encounter (includes time in activities that require the physician or other qualified health care professional and does not include time in activities normally performed by clinical staff). Physician's time may include the following activities when performed: Preparing to see the patient (e.g., pre-charting review of records, searching for previously ordered imaging, lab work, and nerve conduction tests) Review of prior analgesic pharmacotherapies. Reviewing PMP Interpreting ordered tests (e.g., lab work, imaging, nerve conduction tests) Performing post-procedure evaluations, including interpretation of diagnostic procedures Obtaining and/or reviewing separately obtained history Performing a medically appropriate examination and/or evaluation Counseling and educating the patient/family/caregiver Ordering medications, tests, or procedures Referring and communicating with other health care professionals (when not separately reported) Documenting clinical information in the electronic or other health record Independently interpreting results (not separately reported) and communicating results to the patient/ family/caregiver Care coordination (not separately reported)  Note by: Oswaldo Done, MD (AI and TTS technology used. I apologize for any typographical errors that were not detected and corrected.) Date: 01/20/2023; Time: 8:57 AM

## 2023-01-20 NOTE — Patient Instructions (Signed)
____________________________________________________________________________________________  New Patients  Welcome to [facility].   Initial Visit The first or initial visit consists of an evaluation only.   Interventional pain management.  We offer therapies other than opioid controlled substances to manage chronic pain. These include, but are not limited to, diagnostic, therapeutic, and palliative specialized injection therapies (i.e.: Epidural Steroids, Facet Blocks, etc.). We specialize in a variety of nerve blocks as well as radiofrequency treatments. We offer pain implant evaluations and trials, as well as follow up management. In addition we also provide a variety joint injections, including Viscosupplementation (AKA: Gel Therapy).  Prescription Pain Medication. We specialize in alternatives to opioids. We can provide evaluations and recommendations for/of pharmacologic therapies based on CDC Guidelines.  We no er take patients for -term medication management. We will not be taking over your pain medications.  ____________________________________________________________________________________________    ____________________________________________________________________________________________  Patient Information update  To: All of our patients.  Re: Name change.  It has been made official that our current name, "[facility]"   will soon be changed to "[facility]".   The purpose of this change is to eliminate any confusion created by the concept of our practice being a "Medication Management Pain Clinic". In the past this has led to the misconception that we treat pain primarily by the use of prescription medications.  Nothing can be farther from the truth.   Understanding PAIN MANAGEMENT: To  further understand what our practice does, you first have to understand that "Pain Management" is a subspecialty that requires additional training once a physician has completed their specialty training, which can be in either Anesthesia, Neurology, Psychiatry, or Physical Medicine and Rehabilitation (PMR). Each one of these contributes to the final approach taken by each physician to the management of their patient's pain. To be a "Pain Management Specialist" you must have first completed one of the specialty trainings below.  Anesthesiologists - trained in clinical pharmacology and interventional techniques such as nerve blockade and regional as well as central neuroanatomy. They are trained to block pain before, during, and after surgical interventions.  Neurologists - trained in the diagnosis and pharmacological treatment of complex neurological conditions, such as Multiple Sclerosis, Parkinson's, spinal cord injuries, and other systemic conditions that may be associated with symptoms that may include but are not limited to pain. They tend to rely primarily on the treatment of chronic pain using prescription medications.  Psychiatrist - trained in conditions affecting the psychosocial wellbeing of patients including but not limited to depression, anxiety, schizophrenia, personality disorders, addiction, and other substance use disorders that may be associated with chronic pain. They tend to rely primarily on the treatment of chronic pain using prescription medications.   Physical Medicine and Rehabilitation (PMR) physicians, also known as physiatrists - trained to treat a wide variety of medical conditions affecting the brain, spinal cord, nerves, bones, joints, ligaments, muscles, and tendons. Their training is primarily aimed at treating patients that have suffered injuries that have caused severe physical impairment. Their training is primarily aimed at the physical therapy and rehabilitation of those  patients. They may also work aside orthopedic surgeons or neurosurgeons using their expertise in assisting surgical patients to recover after their surgeries.  INTERVENTIONAL PAIN MANAGEMENT is sub-subspecialty of Pain Management.  Our physicians are Board-certified in Anesthesia, Pain Management, and Interventional Pain Management.  This meaning that not only have they been trained   and Board-certified in their specialty of Anesthesia, and subspecialty of Pain Management, but they have also received further training in the sub-subspecialty of Interventional Pain Management, in order to become Board-certified as INTERVENTIONAL PAIN MANAGEMENT SPECIALIST.    Mission: Our goal is to use our skills in  INTERVENTIONAL PAIN MANAGEMENT as alternatives to the chronic use of prescription opioid medications for the treatment of pain. To make this more clear, we have changed our name to reflect what we do and offer. We will continue to offer medication management assessment and recommendations, but we will not be taking over any patient's medication management.  ____________________________________________________________________________________________     

## 2023-01-22 LAB — COMPLIANCE DRUG ANALYSIS, UR

## 2023-02-01 LAB — COMP. METABOLIC PANEL (12)
AST: 18 [IU]/L (ref 0–40)
Albumin: 4.4 g/dL (ref 3.9–4.9)
Alkaline Phosphatase: 99 [IU]/L (ref 44–121)
BUN/Creatinine Ratio: 15 (ref 9–23)
BUN: 10 mg/dL (ref 6–24)
Bilirubin Total: 0.7 mg/dL (ref 0.0–1.2)
Calcium: 9.2 mg/dL (ref 8.7–10.2)
Chloride: 103 mmol/L (ref 96–106)
Creatinine, Ser: 0.65 mg/dL (ref 0.57–1.00)
Globulin, Total: 2.8 g/dL (ref 1.5–4.5)
Glucose: 90 mg/dL (ref 70–99)
Potassium: 4 mmol/L (ref 3.5–5.2)
Sodium: 139 mmol/L (ref 134–144)
Total Protein: 7.2 g/dL (ref 6.0–8.5)
eGFR: 109 mL/min/{1.73_m2} (ref >59)

## 2023-02-01 LAB — SEDIMENTATION RATE: Sed Rate: 13 mm/h (ref 0–32)

## 2023-02-01 LAB — MAGNESIUM: Magnesium: 1.9 mg/dL (ref 1.6–2.3)

## 2023-02-01 LAB — 25-HYDROXY VITAMIN D LCMS D2+D3
25-Hydroxy, Vitamin D-2: <1 ng/mL
25-Hydroxy, Vitamin D-3: 21 ng/mL
25-Hydroxy, Vitamin D: 21 ng/mL — ABNORMAL LOW

## 2023-02-01 LAB — VITAMIN B12: Vitamin B-12: 240 pg/mL (ref 232–1245)

## 2023-02-01 LAB — C-REACTIVE PROTEIN: CRP: <1 mg/L (ref 0–10)

## 2023-02-11 NOTE — Progress Notes (Unsigned)
PROVIDER NOTE: Information contained herein reflects review and annotations entered in association with encounter. Interpretation of such information and data should be left to medically-trained personnel. Information provided to patient can be located elsewhere in the medical record under "Patient Instructions". Document created using STT-dictation technology, any transcriptional errors that may result from process are unintentional.    Patient: Katelyn Haynes  Service Category: E/M  Provider: Oswaldo Done, MD  DOB: 1975-06-25  DOS: 02/12/2023  Referring Provider: Babs Bertin, MD  MRN: [id number]  Specialty: Interventional Pain Management  PCP: Babs Bertin, MD  Type: Established Patient  Setting: Ambulatory outpatient    Location: Office  Delivery: Face-to-face     Primary Reason(s) for Visit: Encounter for evaluation before starting new chronic pain management plan of care (Level of risk: moderate) CC: No chief complaint on file.  HPI  Katelyn Haynes is a 47 y.o. year old, female patient, who comes today for a follow-up evaluation to review the test results and decide on a treatment plan. She has Acute bronchitis; Aortic atherosclerosis (HCC); Bacterial respiratory infection; Class 2 severe obesity due to excess calories with serious comorbidity and body mass index (BMI) of 38.0 to 38.9 in adult [facility]); Family history of cardiovascular disease; HLD (hyperlipidemia); Night sweats; Other chest pain; Pulmonary nodule; Severe asthma; Tobacco use; Vitamin B deficiency; Crushing injury of right middle finger; Chronic low back pain (1ry area of Pain) (Bilateral) w/o sciatica; Cervicalgia; Pain in right hand; Chronic pain syndrome; Pharmacologic therapy; Disorder of skeletal system; Problems influencing health status; Chronic neck and back pain (2ry area of Pain) (Bilateral); Cervicogenic headache (3ry area of Pain) (Bilateral); and Lumbar facet joint pain on their problem list. Her primarily concern today  is the No chief complaint on file.  Pain Assessment: Location:     Radiating:   Onset:   Duration:   Quality:   Severity:  /10 (subjective, self-reported pain score)  Effect on ADL:   Timing:   Modifying factors:   BP:    HR:    Katelyn Haynes comes in today for a follow-up visit after her initial evaluation on 01/20/2023. Today we went over the results of her tests. These were explained in "Layman's terms". During today's appointment we went over my diagnostic impression, as well as the proposed treatment plan.  *** Discussed the use of AI scribe software for clinical note transcription with the patient, who gave verbal consent to proceed.  History of Present Illness           *** Patient presented with interventional treatment options. Katelyn Haynes was informed that I will not be providing medication management. Pharmacotherapy evaluation including recommendations may be offered, if specifically requested.   Controlled Substance Pharmacotherapy Assessment REMS (Risk Evaluation and Mitigation Strategy)  Opioid Analgesic:  None MME/day: 0 mg/day  Pill Count: None expected due to no prior prescriptions written by our practice. No notes on file Pharmacokinetics: Liberation and absorption (onset of action): WNL Distribution (time to peak effect): WNL Metabolism and excretion (duration of action): WNL         Pharmacodynamics: Desired effects: Analgesia: Katelyn Haynes reports >50% benefit. Functional ability: Patient reports that medication allows her to accomplish basic ADLs Clinically meaningful improvement in function (CMIF): Sustained CMIF goals met Perceived effectiveness: Described as relatively effective, allowing for increase in activities of daily living (ADL) Undesirable effects: Side-effects or Adverse reactions: None reported Monitoring: [place] PMP: PDMP reviewed during this encounter. Online review of the past 68-month period previously conducted.  Not applicable at this point  since we have not taken over the patient's medication management yet. List of other Serum/Urine Drug Screening Test(s):  No results found for: "AMPHSCRSER", "BARBSCRSER", "BENZOSCRSER", "COCAINSCRSER", "COCAINSCRNUR", "PCPSCRSER", "THCSCRSER", "THCU", "CANNABQUANT", "OPIATESCRSER", "OXYSCRSER", "PROPOXSCRSER", "ETH", "CBDTHCR", "D8THCCBX", "D9THCCBX" List of all UDS test(s) done:  Lab Results  Component Value Date   SUMMARY FINAL 01/20/2023   Last UDS on record: Summary  Date Value Ref Range Status  01/20/2023 FINAL  Final    Comment:    ==================================================================== Compliance Drug Analysis, Ur ==================================================================== Test                             Result       Flag       Units  Drug Present not Declared for Prescription Verification   7-aminoclonazepam              222          UNEXPECTED ng/mg creat    7-aminoclonazepam is an expected metabolite of clonazepam. Source of    clonazepam is a scheduled prescription medication.    Naproxen                       PRESENT      UNEXPECTED  Drug Absent but Declared for Prescription Verification   Zolpidem                       Not Detected UNEXPECTED    Zolpidem, as indicated in the declared medication list, is not    always detected even when used as directed.  ==================================================================== Test                      Result    Flag   Units      Ref Range   Creatinine              114              mg/dL      >=28 ==================================================================== Declared Medications:  The flagging and interpretation on this report are based on the  following declared medications.  Unexpected results may arise from  inaccuracies in the declared medications.   **Note: The testing scope of this panel does not include small to  moderate amounts of these reported medications:   Zolpidem  (Ambien)   **Note: The testing scope of this panel does not include the  following reported medications:   Albuterol (Ventolin HFA)  Amlodipine (Norvasc)  Buspirone (Buspar)  Esomeprazole (Nexium)  Nitroglycerin (Nitrostat)  Ondansetron (Zofran)  Vitamin B12 ==================================================================== For clinical consultation, please call [phone]. ====================================================================    UDS interpretation: No unexpected findings.          Medication Assessment Form: Not applicable. No opioids. Treatment compliance: Not applicable Risk Assessment Profile: Aberrant behavior: See initial evaluations. None observed or detected today Comorbid factors increasing risk of overdose: See initial evaluation. No additional risks detected today Opioid risk tool (ORT):     01/20/2023    8:00 AM  Opioid Risk   Alcohol 0  Illegal Drugs 0  Rx Drugs 0  Alcohol 0  Illegal Drugs 0  Rx Drugs 0  Age between 16-45 years  0  History of Preadolescent Sexual Abuse 0  Psychological Disease 0  Depression 1  Opioid Risk Tool Scoring 1  Opioid Risk Interpretation Low Risk  ORT Scoring interpretation table:  Score <3 = Low Risk for SUD  Score between 4-7 = Moderate Risk for SUD  Score >8 = High Risk for Opioid Abuse   Risk of substance use disorder (SUD): Low  Risk Mitigation Strategies:  Patient opioid safety counseling: No controlled substances prescribed. Patient-Prescriber Agreement (PPA): No agreement signed.  Controlled substance notification to other providers: None required. No opioid therapy.  Pharmacologic Plan: Non-opioid analgesic therapy offered. Interventional alternatives discussed.             Laboratory Chemistry Profile   Renal Lab Results  Component Value Date   BUN 10 01/20/2023   CREATININE 0.65 01/20/2023   BCR 15 01/20/2023   GFRAA >60 07/01/2019   GFRNONAA >60 08/26/2022   PROTEINUR NEGATIVE  12/12/2021     Electrolytes Lab Results  Component Value Date   NA 139 01/20/2023   K 4.0 01/20/2023   CL 103 01/20/2023   CALCIUM 9.2 01/20/2023   MG 1.9 01/20/2023     Hepatic Lab Results  Component Value Date   AST 18 01/20/2023   ALT 18 04/17/2022   ALBUMIN 4.4 01/20/2023   ALKPHOS 99 01/20/2023   LIPASE 28 04/17/2022     ID Lab Results  Component Value Date   SARSCOV2NAA NEGATIVE 04/17/2022   PREGTESTUR NEGATIVE 12/12/2021     Bone Lab Results  Component Value Date   25OHVITD1 21 (L) 01/20/2023   25OHVITD2 <1.0 01/20/2023   25OHVITD3 21 01/20/2023     Endocrine Lab Results  Component Value Date   GLUCOSE 90 01/20/2023   GLUCOSEU NEGATIVE 12/12/2021     Neuropathy Lab Results  Component Value Date   VITAMINB12 240 01/20/2023     CNS No results found for: "COLORCSF", "APPEARCSF", "RBCCOUNTCSF", "WBCCSF", "POLYSCSF", "LYMPHSCSF", "EOSCSF", "PROTEINCSF", "GLUCCSF", "JCVIRUS", "CSFOLI", "IGGCSF", "LABACHR", "ACETBL"   Inflammation (CRP: Acute  ESR: Chronic) Lab Results  Component Value Date   CRP <1 01/20/2023   ESRSEDRATE 13 01/20/2023     Rheumatology No results found for: "RF", "ANA", "LABURIC", "URICUR", "LYMEIGGIGMAB", "LYMEABIGMQN", "HLAB27"   Coagulation Lab Results  Component Value Date   PLT 198 08/26/2022   DDIMER 0.73 (H) 08/26/2022     Cardiovascular Lab Results  Component Value Date   HGB 14.0 08/26/2022   HCT 39.0 08/26/2022     Screening Lab Results  Component Value Date   SARSCOV2NAA NEGATIVE 04/17/2022   PREGTESTUR NEGATIVE 12/12/2021     Cancer No results found for: "CEA", "CA125", "LABCA2"   Allergens No results found for: "ALMOND", "APPLE", "ASPARAGUS", "AVOCADO", "BANANA", "BARLEY", "BASIL", "BAYLEAF", "GREENBEAN", "LIMABEAN", "WHITEBEAN", "BEEFIGE", "REDBEET", "BLUEBERRY", "BROCCOLI", "CABBAGE", "MELON", "CARROT", "CASEIN", "CASHEWNUT", "CAULIFLOWER", "CELERY"     Note: Lab results reviewed.  Recent  Diagnostic Imaging Review  Cervical Imaging: Cervical MR wo contrast: No results found for this or any previous visit.  Cervical MR wo contrast: No results found for this or any previous visit.  Cervical CT wo contrast: Results for orders placed during the hospital encounter of 02/19/22  CT Cervical Spine Wo Contrast  Narrative CLINICAL DATA:  Fall down stairs, head and face injury, neck pain. Neck trauma, dangerous injury mechanism (Age 30-64y)  EXAM: CT CERVICAL SPINE WITHOUT CONTRAST  TECHNIQUE: Multidetector CT imaging of the cervical spine was performed without intravenous contrast. Multiplanar CT image reconstructions were also generated.  RADIATION DOSE REDUCTION: This exam was performed according to the departmental dose-optimization program which includes automated exposure control, adjustment of the mA and/or kV according to patient  size and/or use of iterative reconstruction technique.  COMPARISON:  None Available.  FINDINGS: Alignment: Normal.  Skull base and vertebrae: No acute fracture. No primary bone lesion or focal pathologic process.  Soft tissues and spinal canal: No prevertebral fluid or swelling. No visible canal hematoma.  Disc levels: Intervertebral disc space narrowing and endplate remodeling at C5-6 in keeping with mild degenerative disc disease. Prevertebral soft tissues are not thickened on sagittal reformats. Spinal canal is widely patent. Uncovertebral arthrosis results in mild bilateral neuroforaminal narrowing at C5-6, left greater than right.  Upper chest: Negative.  Other: None  IMPRESSION: 1. No acute fracture or listhesis of the cervical spine. 2. Mild degenerative disc disease and uncovertebral arthrosis at C5-6 resulting in mild bilateral neuroforaminal narrowing, left greater than right.   Electronically Signed By: Helyn Numbers M.D. On: 02/18/2022 23:55  Cervical DG Bending/F/E views: Results for orders placed during  the hospital encounter of 01/20/23  DG Cervical Spine With Flex & Extend  Narrative CLINICAL DATA:  Cervicalgia, chronic neck pain  EXAM: CERVICAL SPINE COMPLETE WITH FLEXION AND EXTENSION 7 VIEWS  COMPARISON:  09/20/2019.  FINDINGS: No fracture, dislocation or subluxation. No spondylolisthesis. No osteolytic or osteoblastic changes. Prevertebral and cervical cranial soft tissues are unremarkable. No motion on flexion and extension to suggest instability.  Degenerative disc disease noted with disc space narrowing and marginal osteophytes at C5-6.  IMPRESSION: Degenerative changes. No acute osseous abnormalities.   Electronically Signed By: Layla Maw M.D. On: 01/26/2023 18:56   Shoulder Imaging: Shoulder-R MR wo contrast: No results found for this or any previous visit.  Shoulder-L MR wo contrast: No results found for this or any previous visit.  Shoulder-R DG: Results for orders placed during the hospital encounter of 08/31/19  DG Shoulder Right  Narrative CLINICAL DATA:  Right shoulder pain  EXAM: RIGHT SHOULDER - 2+ VIEW  COMPARISON:  None.  FINDINGS: There is no evidence of fracture or dislocation. There is no evidence of arthropathy or other focal bone abnormality. Soft tissues are unremarkable.  IMPRESSION: Negative.   Electronically Signed By: Duanne Guess D.O. On: 08/31/2019 12:05  Shoulder-L DG: No results found for this or any previous visit.   Thoracic Imaging: Thoracic MR wo contrast: No results found for this or any previous visit.  Thoracic MR wo contrast: No results found for this or any previous visit.  Thoracic CT wo contrast: No results found for this or any previous visit.  Thoracic DG 4 views: No results found for this or any previous visit.  Thoracic DG w/swimmers view: No results found for this or any previous visit.   Lumbosacral Imaging: Lumbar MR wo contrast: No results found for this or any previous  visit.  Lumbar MR wo contrast: No results found for this or any previous visit.  Lumbar CT wo contrast: No results found for this or any previous visit.  Lumbar DG Bending views: Results for orders placed during the hospital encounter of 01/20/23  DG Lumbar Spine Complete W/Bend  Narrative CLINICAL DATA:  Lower back pain  EXAM: LUMBAR SPINE - COMPLETE WITH BENDING 7 VIEWS  COMPARISON:  None Available.  FINDINGS: There is no evidence of lumbar spine fracture. Alignment is normal. Intervertebral disc spaces are maintained. No motion on flexion and extension to suggest instability. Aortoiliac atheromatous calcifications are noted.  IMPRESSION: Negative.   Electronically Signed By: Layla Maw M.D. On: 01/26/2023 18:57         Sacroiliac Joint Imaging: Sacroiliac Joint  DG: No results found for this or any previous visit.   Hip Imaging: Hip-R MR wo contrast: No results found for this or any previous visit.  Hip-L MR wo contrast: No results found for this or any previous visit.  Hip-R CT wo contrast: No results found for this or any previous visit.  Hip-L CT wo contrast: No results found for this or any previous visit.  Hip-R DG 2-3 views: No results found for this or any previous visit.  Hip-L DG 2-3 views: No results found for this or any previous visit.  Hip-B DG Bilateral: No results found for this or any previous visit.  Hip-B DG Bilateral (5V): No results found for this or any previous visit.   Knee Imaging: Knee-R MR wo contrast: No results found for this or any previous visit.  Knee-L MR wo contrast: No results found for this or any previous visit.  Knee-R CT wo contrast: No results found for this or any previous visit.  Knee-L CT wo contrast: No results found for this or any previous visit.  Knee-R DG 4 views: Results for orders placed during the hospital encounter of 02/19/22  DG Knee Complete 4 Views Right  Narrative CLINICAL DATA:   Injury, fall.  EXAM: RIGHT KNEE - COMPLETE 4+ VIEW  COMPARISON:  None Available.  FINDINGS: No evidence of fracture, dislocation, or joint effusion. No evidence of arthropathy or other focal bone abnormality. Soft tissues are unremarkable.  IMPRESSION: Negative.   Electronically Signed By: Larose Hires D.O. On: 02/18/2022 23:56  Knee-L DG 4 views: No results found for this or any previous visit.   Ankle Imaging: Ankle-R DG Complete: No results found for this or any previous visit.  Ankle-L DG Complete: No results found for this or any previous visit.   Foot Imaging: Foot-R DG Complete: No results found for this or any previous visit.  Foot-L DG Complete: No results found for this or any previous visit.   Elbow Imaging: Elbow-R DG Complete: Results for orders placed during the hospital encounter of 02/19/22  DG Elbow Complete Right  Narrative CLINICAL DATA:  Injury, fall. Per EMS patient fell down 13 stairs. Right elbow pain.  EXAM: RIGHT ELBOW - COMPLETE 3+ VIEW  COMPARISON:  None Available.  FINDINGS: There is no evidence of fracture, dislocation, or joint effusion. There is no evidence of arthropathy or other focal bone abnormality. Soft tissues are unremarkable.  IMPRESSION: Negative.   Electronically Signed By: Larose Hires D.O. On: 02/19/2022 00:02  Elbow-L DG Complete: No results found for this or any previous visit.   Wrist Imaging: Wrist-R DG Complete: No results found for this or any previous visit.  Wrist-L DG Complete: No results found for this or any previous visit.   Hand Imaging: Hand-R DG Complete: No results found for this or any previous visit.  Hand-L DG Complete: No results found for this or any previous visit.   Complexity Note: Imaging results reviewed.                         Meds   Current Outpatient Medications:    albuterol (PROVENTIL) (2.5 MG/3ML) 0.083% nebulizer solution, Inhale into the lungs., Disp: ,  Rfl:    albuterol (VENTOLIN HFA) 108 (90 Base) MCG/ACT inhaler, Inhale 2 puffs into the lungs every 6 (six) hours as needed for wheezing or shortness of breath., Disp: 8 g, Rfl: 0   albuterol (VENTOLIN HFA) 108 (90 Base) MCG/ACT inhaler,  Inhale into the lungs., Disp: , Rfl:    amLODipine (NORVASC) 2.5 MG tablet, Take 1 tablet by mouth daily., Disp: , Rfl:    busPIRone (BUSPAR) 30 MG tablet, Take by mouth., Disp: , Rfl:    cyanocobalamin (VITAMIN B12) 1000 MCG/ML injection, Inject into the muscle., Disp: , Rfl:    esomeprazole (NEXIUM) 20 MG capsule, Take by mouth., Disp: , Rfl:    nitroGLYCERIN (NITROSTAT) 0.4 MG SL tablet, Place under the tongue., Disp: , Rfl:    ondansetron (ZOFRAN-ODT) 4 MG disintegrating tablet, Take 1 tablet (4 mg total) by mouth every 8 (eight) hours as needed for nausea or vomiting., Disp: 20 tablet, Rfl: 0   zolpidem (AMBIEN) 5 MG tablet, Take 5 mg by mouth at bedtime as needed., Disp: , Rfl:   ROS  Constitutional: Denies any fever or chills Gastrointestinal: No reported hemesis, hematochezia, vomiting, or acute GI distress Musculoskeletal: Denies any acute onset joint swelling, redness, loss of ROM, or weakness Neurological: No reported episodes of acute onset apraxia, aphasia, dysarthria, agnosia, amnesia, paralysis, loss of coordination, or loss of consciousness  Allergies  Ms. Cosby is allergic to bactrim [sulfamethoxazole-trimethoprim], bee venom, compazine [prochlorperazine], contrast media [iodinated contrast media], meloxicam, seroquel [quetiapine], and tramadol.  PFSH  Drug: Ms. Murdick  has no history on file for drug use. Alcohol:  reports current alcohol use. Tobacco:  reports that she has been smoking. She has never used smokeless tobacco. Medical:  has a past medical history of Allergy, Arthritis, Chronic bronchitis (HCC), Depression, and GERD (gastroesophageal reflux disease). Surgical: Ms. Rotz  has a past surgical history that includes Cesarean section;  Carpal tunnel release (Right); and Cardiac surgery. Family: family history is not on file.  Constitutional Exam  General appearance: Well nourished, well developed, and well hydrated. In no apparent acute distress There were no vitals filed for this visit. BMI Assessment: Estimated body mass index is 39.06 kg/m as calculated from the following:   Height as of 01/20/23: 5' (1.524 m).   Weight as of 01/20/23: 200 lb (90.7 kg).  BMI interpretation table: BMI level Category Range association with higher incidence of chronic pain  <18 kg/m2 Underweight   18.5-24.9 kg/m2 Ideal body weight   25-29.9 kg/m2 Overweight Increased incidence by 20%  30-34.9 kg/m2 Obese (Class I) Increased incidence by 68%  35-39.9 kg/m2 Severe obesity (Class II) Increased incidence by 136%  >40 kg/m2 Extreme obesity (Class III) Increased incidence by 254%   Patient's current BMI Ideal Body weight  There is no height or weight on file to calculate BMI. Patient weight not recorded   BMI Readings from Last 4 Encounters:  01/20/23 39.06 kg/m  08/26/22 36.66 kg/m  04/17/22 32.92 kg/m  02/18/22 32.96 kg/m   Wt Readings from Last 4 Encounters:  01/20/23 200 lb (90.7 kg)  08/26/22 213 lb 9.6 oz (96.9 kg)  04/17/22 191 lb 12.8 oz (87 kg)  02/18/22 192 lb (87.1 kg)    Psych/Mental status: Alert, oriented x 3 (person, place, & time)       Eyes: PERLA Respiratory: No evidence of acute respiratory distress  Assessment & Plan  Primary Diagnosis & Pertinent Problem List: There were no encounter diagnoses. Visit Diagnosis: No diagnosis found. Problems updated and reviewed during this visit: No problems updated.  Plan of Care  Assessment and Plan             Pharmacotherapy (Medications Ordered): No orders of the defined types were placed in this encounter.  Procedure Orders  No procedure(s) ordered today   Lab Orders  No laboratory test(s) ordered today   Imaging Orders  No imaging studies  ordered today   Referral Orders  No referral(s) requested today    Pharmacological management:  Opioid Analgesics: I will not be prescribing any opioids at this time Membrane stabilizer: I will not be prescribing any at this time Muscle relaxant: I will not be prescribing any at this time NSAID: I will not be prescribing any at this time Other analgesic(s): I will not be prescribing any at this time      Interventional Therapies  Risk Factors  Considerations  Medical Comorbidities:     Planned  Pending:      Under consideration:   Diagnostic bilateral lumbar facet block #1    Completed:   None at this time   Therapeutic  Palliative (PRN) options:   None established   Completed by other providers:   Physical therapy  Diagnostic bilateral lumbar facet blocks  Therapeutic lumbar facet RFA        Provider-requested follow-up: No follow-ups on file. Recent Visits Date Type Provider Dept  01/20/23 Office Visit Delano Metz, MD [facility]  Showing recent visits within past 90 days and meeting all other requirements Future Appointments Date Type Provider Dept  02/12/23 Appointment Delano Metz, MD [facility]  Showing future appointments within next 90 days and meeting all other requirements   Primary Care Physician: Babs Bertin, MD  Duration of encounter: *** minutes.  Total time on encounter, as per AMA guidelines included both the face-to-face and non-face-to-face time personally spent by the physician and/or other qualified health care professional(s) on the day of the encounter (includes time in activities that require the physician or other qualified health care professional and does not include time in activities normally performed by clinical staff). Physician's time may include the following activities when performed: Preparing to see the patient (e.g., pre-charting review of records, searching for previously ordered  imaging, lab work, and nerve conduction tests) Review of prior analgesic pharmacotherapies. Reviewing PMP Interpreting ordered tests (e.g., lab work, imaging, nerve conduction tests) Performing post-procedure evaluations, including interpretation of diagnostic procedures Obtaining and/or reviewing separately obtained history Performing a medically appropriate examination and/or evaluation Counseling and educating the patient/family/caregiver Ordering medications, tests, or procedures Referring and communicating with other health care professionals (when not separately reported) Documenting clinical information in the electronic or other health record Independently interpreting results (not separately reported) and communicating results to the patient/ family/caregiver Care coordination (not separately reported)  Note by: Oswaldo Done, MD (TTS technology used. I apologize for any typographical errors that were not detected and corrected.) Date: 02/12/2023; Time: 9:18 AM

## 2023-02-12 ENCOUNTER — Encounter: Payer: Self-pay | Admitting: Pain Medicine

## 2023-02-12 ENCOUNTER — Ambulatory Visit: Payer: 59 | Attending: Pain Medicine | Admitting: Pain Medicine

## 2023-02-12 VITALS — BP 142/87 | HR 70 | Temp 97.5°F | Resp 16 | Ht 60.0 in | Wt 200.0 lb

## 2023-02-12 DIAGNOSIS — M79605 Pain in left leg: Secondary | ICD-10-CM | POA: Insufficient documentation

## 2023-02-12 DIAGNOSIS — M542 Cervicalgia: Secondary | ICD-10-CM | POA: Diagnosis present

## 2023-02-12 DIAGNOSIS — G4486 Cervicogenic headache: Secondary | ICD-10-CM | POA: Diagnosis not present

## 2023-02-12 DIAGNOSIS — M5459 Other low back pain: Secondary | ICD-10-CM | POA: Insufficient documentation

## 2023-02-12 DIAGNOSIS — Z91041 Radiographic dye allergy status: Secondary | ICD-10-CM | POA: Insufficient documentation

## 2023-02-12 DIAGNOSIS — M47816 Spondylosis without myelopathy or radiculopathy, lumbar region: Secondary | ICD-10-CM | POA: Diagnosis present

## 2023-02-12 DIAGNOSIS — R937 Abnormal findings on diagnostic imaging of other parts of musculoskeletal system: Secondary | ICD-10-CM | POA: Insufficient documentation

## 2023-02-12 DIAGNOSIS — M47812 Spondylosis without myelopathy or radiculopathy, cervical region: Secondary | ICD-10-CM | POA: Insufficient documentation

## 2023-02-12 DIAGNOSIS — M5030 Other cervical disc degeneration, unspecified cervical region: Secondary | ICD-10-CM | POA: Insufficient documentation

## 2023-02-12 DIAGNOSIS — G8929 Other chronic pain: Secondary | ICD-10-CM | POA: Insufficient documentation

## 2023-02-12 DIAGNOSIS — M549 Dorsalgia, unspecified: Secondary | ICD-10-CM | POA: Insufficient documentation

## 2023-02-12 DIAGNOSIS — Z888 Allergy status to other drugs, medicaments and biological substances status: Secondary | ICD-10-CM | POA: Diagnosis present

## 2023-02-12 DIAGNOSIS — M545 Low back pain, unspecified: Secondary | ICD-10-CM | POA: Insufficient documentation

## 2023-02-12 DIAGNOSIS — E559 Vitamin D deficiency, unspecified: Secondary | ICD-10-CM | POA: Diagnosis present

## 2023-02-12 MED ORDER — ERGOCALCIFEROL 1.25 MG (50000 UT) PO CAPS: 50000.0000 [IU] | ORAL_CAPSULE | ORAL | 0 refills | Status: AC

## 2023-02-12 MED ORDER — VITAMIN D3 125 MCG (5000 UT) PO CAPS: 5000.0000 [IU] | ORAL_CAPSULE | Freq: Every day | ORAL | 0 refills | Status: AC

## 2023-02-12 NOTE — Patient Instructions (Signed)

## 2023-02-27 IMAGING — DX DG CHEST 1V PORT
1 series · 1 of 1 positions shown · non-contrast
Comparison: July 01, 2019.

CLINICAL DATA: Chest pain.

EXAM:
PORTABLE CHEST 1 VIEW

[chest ap]
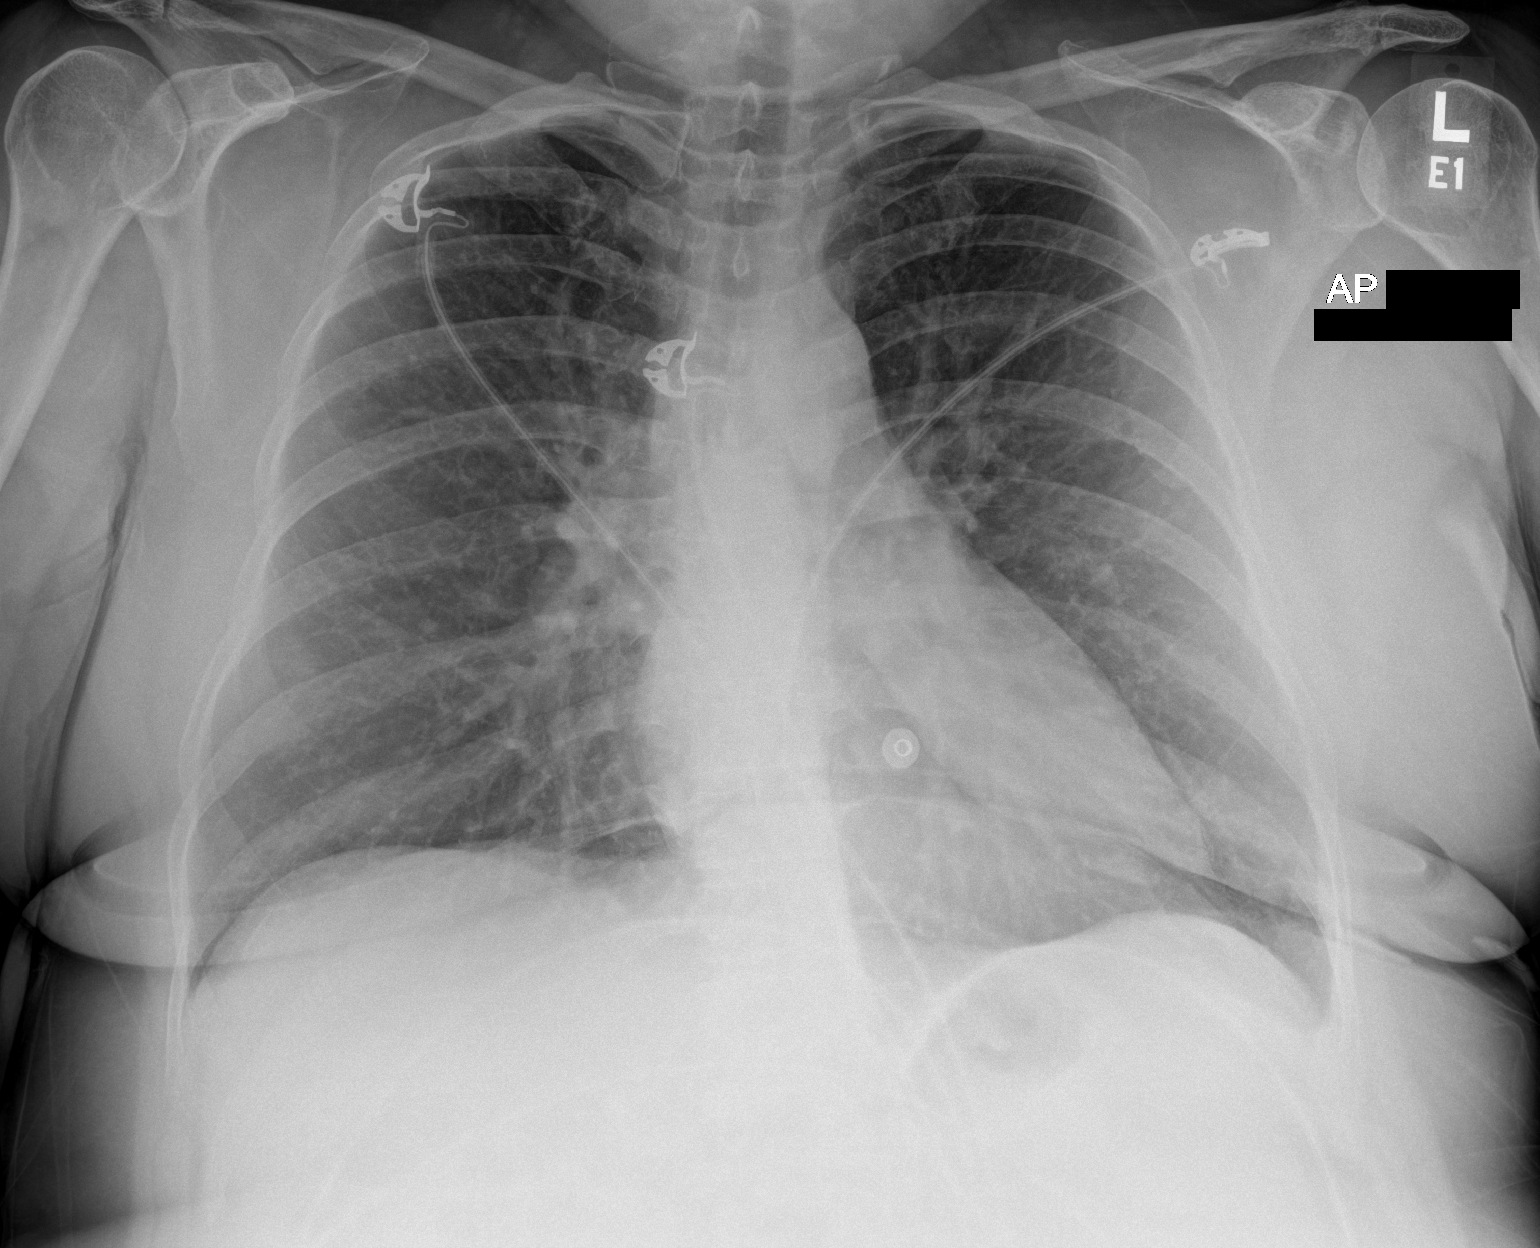

[1 of 1 positions shown; findings below may reference images not displayed]

FINDINGS: The heart size and mediastinal contours are within normal limits. No
consolidation. Prominent nipple shadows, similar to prior. The
visualized skeletal structures are unremarkable.
IMPRESSION: No active disease.

## 2023-03-13 ENCOUNTER — Ambulatory Visit
Admission: RE | Admit: 2023-03-13 | Discharge: 2023-03-13 | Disposition: A | Discharge status: Home / Self Care | Payer: 59 | Source: Ambulatory Visit | Attending: Pain Medicine | Admitting: Pain Medicine

## 2023-03-13 ENCOUNTER — Encounter: Payer: Self-pay | Admitting: Pain Medicine

## 2023-03-13 ENCOUNTER — Ambulatory Visit: Payer: 59 | Attending: Pain Medicine | Admitting: Pain Medicine

## 2023-03-13 VITALS — BP 121/80 | HR 79 | Temp 97.7°F | Resp 21 | Ht 60.0 in | Wt 198.0 lb

## 2023-03-13 DIAGNOSIS — F419 Anxiety disorder, unspecified: Secondary | ICD-10-CM | POA: Diagnosis present

## 2023-03-13 DIAGNOSIS — M47812 Spondylosis without myelopathy or radiculopathy, cervical region: Secondary | ICD-10-CM

## 2023-03-13 DIAGNOSIS — M542 Cervicalgia: Secondary | ICD-10-CM | POA: Insufficient documentation

## 2023-03-13 DIAGNOSIS — M5030 Other cervical disc degeneration, unspecified cervical region: Secondary | ICD-10-CM | POA: Diagnosis present

## 2023-03-13 DIAGNOSIS — Z91041 Radiographic dye allergy status: Secondary | ICD-10-CM | POA: Insufficient documentation

## 2023-03-13 DIAGNOSIS — M549 Dorsalgia, unspecified: Secondary | ICD-10-CM | POA: Insufficient documentation

## 2023-03-13 DIAGNOSIS — R4589 Other symptoms and signs involving emotional state: Secondary | ICD-10-CM | POA: Insufficient documentation

## 2023-03-13 DIAGNOSIS — R937 Abnormal findings on diagnostic imaging of other parts of musculoskeletal system: Secondary | ICD-10-CM | POA: Insufficient documentation

## 2023-03-13 DIAGNOSIS — R110 Nausea: Secondary | ICD-10-CM | POA: Diagnosis present

## 2023-03-13 DIAGNOSIS — G8929 Other chronic pain: Secondary | ICD-10-CM | POA: Insufficient documentation

## 2023-03-13 DIAGNOSIS — Z888 Allergy status to other drugs, medicaments and biological substances status: Secondary | ICD-10-CM | POA: Insufficient documentation

## 2023-03-13 MED ORDER — DEXAMETHASONE SODIUM PHOSPHATE 10 MG/ML IJ SOLN
20.0000 mg | Freq: Once | INTRAMUSCULAR | Status: AC
  Administered 2023-03-13: 20 mg

## 2023-03-13 MED ORDER — LIDOCAINE HCL 2 % IJ SOLN
20.0000 mL | Freq: Once | INTRAMUSCULAR | Status: AC
  Administered 2023-03-13: 400 mg

## 2023-03-13 MED ORDER — FENTANYL CITRATE (PF) 100 MCG/2ML IJ SOLN
INTRAMUSCULAR | Status: AC
  Filled 2023-03-13: qty 2

## 2023-03-13 MED ORDER — FENTANYL CITRATE (PF) 100 MCG/2ML IJ SOLN
25.0000 ug | INTRAMUSCULAR | Status: DC | PRN
  Administered 2023-03-13: 100 ug via INTRAVENOUS

## 2023-03-13 MED ORDER — ROPIVACAINE HCL 2 MG/ML IJ SOLN
INTRAMUSCULAR | Status: AC
  Filled 2023-03-13: qty 20

## 2023-03-13 MED ORDER — MIDAZOLAM HCL 5 MG/5ML IJ SOLN
INTRAMUSCULAR | Status: AC
  Filled 2023-03-13: qty 5

## 2023-03-13 MED ORDER — PENTAFLUOROPROP-TETRAFLUOROETH EX AERO
INHALATION_SPRAY | Freq: Once | CUTANEOUS | Status: AC
  Administered 2023-03-13: 30 via TOPICAL

## 2023-03-13 MED ORDER — ONDANSETRON HCL 4 MG/2ML IJ SOLN
INTRAMUSCULAR | Status: AC
  Filled 2023-03-13: qty 2

## 2023-03-13 MED ORDER — MIDAZOLAM HCL 5 MG/5ML IJ SOLN
0.5000 mg | Freq: Once | INTRAMUSCULAR | Status: AC
  Administered 2023-03-13: 4 mg via INTRAVENOUS

## 2023-03-13 MED ORDER — ONDANSETRON HCL 4 MG/2ML IJ SOLN
4.0000 mg | Freq: Once | INTRAMUSCULAR | Status: AC
  Administered 2023-03-13: 4 mg via INTRAVENOUS

## 2023-03-13 MED ORDER — LIDOCAINE HCL 2 % IJ SOLN
INTRAMUSCULAR | Status: AC
  Filled 2023-03-13: qty 20

## 2023-03-13 MED ORDER — DEXAMETHASONE SODIUM PHOSPHATE 10 MG/ML IJ SOLN
INTRAMUSCULAR | Status: AC
  Filled 2023-03-13: qty 2

## 2023-03-13 MED ORDER — ROPIVACAINE HCL 2 MG/ML IJ SOLN
18.0000 mL | Freq: Once | INTRAMUSCULAR | Status: AC
  Administered 2023-03-13: 18 mL via PERINEURAL

## 2023-03-13 NOTE — Patient Instructions (Signed)

## 2023-03-13 NOTE — Progress Notes (Signed)
PROVIDER NOTE: Interpretation of information contained herein should be left to medically-trained personnel. Specific patient instructions are provided elsewhere under "Patient Instructions" section of medical record. This document was created in part using STT-dictation technology, any transcriptional errors that may result from this process are unintentional.  Patient: Katelyn Haynes Type: Established DOB: 04/14/1975 MRN: [id number] PCP: Babs Bertin, MD  Service: Procedure DOS: 03/13/2023 Setting: Ambulatory Location: Ambulatory outpatient facility Delivery: Face-to-face Provider: Oswaldo Done, MD Specialty: Interventional Pain Management Specialty designation: 09 Location: Outpatient facility Ref. Prov.: Delano Metz, MD       Interventional Therapy   Procedure: Cervical Facet Medial Branch Block(s) #1  Laterality: Bilateral  Level: C3, C4, C5, and C6 Medial Branch Level(s). Injecting these levels blocks the C3-4, C4-5, and C5-6 cervical facet joints.  Imaging: Fluoroscopic guidance Anesthesia: Local anesthesia (1-2% Lidocaine) Anxiolysis: IV Versed 4.0 mg Sedation: Moderate Sedation Fentanyl 2.0 mL (100 mcg) DOS: 03/13/2023  Performed by: Oswaldo Done, MD  Purpose: Diagnostic/Therapeutic Indications: Cervicalgia (cervical spine axial pain) severe enough to impact quality of life or function. 1. Cervicalgia   2. Cervical facet joint pain (Bilateral)   3. Cervical facet syndrome (Bilateral)   4. DDD (degenerative disc disease), cervical (C5-6)   5. Spondylosis without myelopathy or radiculopathy, cervical region   6. Abnormal CT scan, cervical spine (02/18/2022)   7. History of CONTRAST Allergy (Anaphylactic-Type)    NAS-11 Pain score:   Pre-procedure: 7 /10   Post-procedure: 0-No pain/10     Position / Prep / Materials:  Position: Prone. Head in cradle. C-spine slightly flexed. Prep solution: ChloraPrep (2% chlorhexidine gluconate and 70% isopropyl  alcohol) Prep Area: Posterior Cervico-thoracic Region. From occipital ridge to tip of scapula, and from shoulder to shoulder. Entire posterior and lateral neck surface. Materials:  Tray: Block Needle(s):  Type: Spinal  Gauge (G): 22  Length: 3.5-in  Qty:  4      H&P (Pre-op Assessment):  Katelyn Haynes is a 48 y.o. (year old), female patient, seen today for interventional treatment. She  has a past surgical history that includes Cesarean section; Carpal tunnel release (Right); and Cardiac surgery. Katelyn Haynes has a current medication list which includes the following prescription(s): albuterol, albuterol, albuterol, buspirone, vitamin d3, clonazepam, cyanocobalamin, ergocalciferol, esomeprazole, fluticasone, and zolpidem, and the following Facility-Administered Medications: fentanyl. Her primarily concern today is the Neck Pain  Initial Vital Signs:  Pulse/HCG Rate: 79ECG Heart Rate: 94 (nsr) Temp: 98.2 F (36.8 C) Resp: 16 BP: 132/83 SpO2: 100 %  BMI: Estimated body mass index is 38.67 kg/m as calculated from the following:   Height as of this encounter: 5' (1.524 m).   Weight as of this encounter: 198 lb (89.8 kg).  Risk Assessment: Allergies: Reviewed. She is allergic to bactrim [sulfamethoxazole-trimethoprim], bee venom, compazine [prochlorperazine], contrast media [iodinated contrast media], meloxicam, seroquel [quetiapine], and tramadol.  Allergy Precautions: None required Coagulopathies: Reviewed. None identified.  Blood-thinner therapy: None at this time Active Infection(s): Reviewed. None identified. Katelyn Haynes is afebrile  Site Confirmation: Katelyn Haynes was asked to confirm the procedure and laterality before marking the site Procedure checklist: Completed Consent: Before the procedure and under the influence of no sedative(s), amnesic(s), or anxiolytics, the patient was informed of the treatment options, risks and possible complications. To fulfill our ethical and legal obligations,  as recommended by the American Medical Association's Code of Ethics, I have informed the patient of my clinical impression; the nature and purpose of the treatment or procedure; the risks, benefits,  and possible complications of the intervention; the alternatives, including doing nothing; the risk(s) and benefit(s) of the alternative treatment(s) or procedure(s); and the risk(s) and benefit(s) of doing nothing. The patient was provided information about the general risks and possible complications associated with the procedure. These may include, but are not limited to: failure to achieve desired goals, infection, bleeding, organ or nerve damage, allergic reactions, paralysis, and death. In addition, the patient was informed of those risks and complications associated to Spine-related procedures, such as failure to decrease pain; infection (i.e.: Meningitis, epidural or intraspinal abscess); bleeding (i.e.: epidural hematoma, subarachnoid hemorrhage, or any other type of intraspinal or peri-dural bleeding); organ or nerve damage (i.e.: Any type of peripheral nerve, nerve root, or spinal cord injury) with subsequent damage to sensory, motor, and/or autonomic systems, resulting in permanent pain, numbness, and/or weakness of one or several areas of the body; allergic reactions; (i.e.: anaphylactic reaction); and/or death. Furthermore, the patient was informed of those risks and complications associated with the medications. These include, but are not limited to: allergic reactions (i.e.: anaphylactic or anaphylactoid reaction(s)); adrenal axis suppression; blood sugar elevation that in diabetics may result in ketoacidosis or comma; water retention that in patients with history of congestive heart failure may result in shortness of breath, pulmonary edema, and decompensation with resultant heart failure; weight gain; swelling or edema; medication-induced neural toxicity; particulate matter embolism and blood vessel  occlusion with resultant organ, and/or nervous system infarction; and/or aseptic necrosis of one or more joints. Finally, the patient was informed that Medicine is not an exact science; therefore, there is also the possibility of unforeseen or unpredictable risks and/or possible complications that may result in a catastrophic outcome. The patient indicated having understood very clearly. We have given the patient no guarantees and we have made no promises. Enough time was given to the patient to ask questions, all of which were answered to the patient's satisfaction. Ms. Elter has indicated that she wanted to continue with the procedure. Attestation: I, the ordering provider, attest that I have discussed with the patient the benefits, risks, side-effects, alternatives, likelihood of achieving goals, and potential problems during recovery for the procedure that I have provided informed consent. Date  Time: 03/13/2023  8:07 AM  Pre-Procedure Preparation:  Monitoring: As per clinic protocol. Respiration, ETCO2, SpO2, BP, heart rate and rhythm monitor placed and checked for adequate function Safety Precautions: Patient was assessed for positional comfort and pressure points before starting the procedure. Time-out: I initiated and conducted the "Time-out" before starting the procedure, as per protocol. The patient was asked to participate by confirming the accuracy of the "Time Out" information. Verification of the correct person, site, and procedure were performed and confirmed by me, the nursing staff, and the patient. "Time-out" conducted as per Joint Commission's Universal Protocol (UP.01.01.01). Time: 0827 Start Time: 0827 hrs.  Description/Narrative of Procedure:          Laterality: See above. Targeted Levels: See above.  Rationale (medical necessity): procedure needed and proper for the diagnosis and/or treatment of the patient's medical symptoms and needs. Procedural Technique Safety Precautions:  Aspiration looking for blood return was conducted prior to all injections. At no point did we inject any substances, as a needle was being advanced. No attempts were made at seeking any paresthesias. Safe injection practices and needle disposal techniques used. Medications properly checked for expiration dates. SDV (single dose vial) medications used. Description of the Procedure: Protocol guidelines were followed. The patient was assisted into a  comfortable position. The target area was identified and the area prepped in the usual manner. Skin & deeper tissues infiltrated with local anesthetic. Appropriate amount of time allowed to pass for local anesthetics to take effect. The procedure needles were then advanced to the target area. Proper needle placement secured. Negative aspiration confirmed. Solution injected in intermittent fashion, asking for systemic symptoms every 0.5cc of injectate. The needles were then removed and the area cleansed, making sure to leave some of the prepping solution back to take advantage of its Mcelroy term bactericidal properties.  Technical description of process:  C3 Medial Branch Nerve Block (MBB): The target area for the C3 dorsal medial articular branch is the lateral concave waist of the articular pillar of C3. Under fluoroscopic guidance, a Quincke needle was inserted until contact was made with os over the postero-lateral aspect of the articular pillar of C3 (target area). After negative aspiration for blood, 0.5 mL of the nerve block solution was injected without difficulty or complication. The needle was removed intact. C4 Medial Branch Nerve Block (MBB): The target area for the C4 dorsal medial articular branch is the lateral concave waist of the articular pillar of C4. Under fluoroscopic guidance, a Quincke needle was inserted until contact was made with os over the postero-lateral aspect of the articular pillar of C4 (target area). After negative aspiration for blood,  0.5 mL of the nerve block solution was injected without difficulty or complication. The needle was removed intact. C5 Medial Branch Nerve Block (MBB): The target area for the C5 dorsal medial articular branch is the lateral concave waist of the articular pillar of C5. Under fluoroscopic guidance, a Quincke needle was inserted until contact was made with os over the postero-lateral aspect of the articular pillar of C5 (target area). After negative aspiration for blood, 0.5 mL of the nerve block solution was injected without difficulty or complication. The needle was removed intact. C6 Medial Branch Nerve Block (MBB): The target area for the C6 dorsal medial articular branch is the lateral concave waist of the articular pillar of C6. Under fluoroscopic guidance, a Quincke needle was inserted until contact was made with os over the postero-lateral aspect of the articular pillar of C6 (target area). After negative aspiration for blood, 0.5 mL of the nerve block solution was injected without difficulty or complication. The needle was removed intact.  Once the entire procedure was completed, the treated area was cleaned, making sure to leave some of the prepping solution back to take advantage of its Verley term bactericidal properties.  Anatomy Reference Guide:      Facet Joint Innervation  C1-2 Third occipital Nerve (TON)  C2-3 TON, C3  Medial Branch  C3-4 C3, C4         "          "  C4-5 C4, C5         "          "  C5-6 C5, C6         "          "  C6-7 C6, C7         "          "  C7-T1 C7, C8         "          "   Cervical Facet Pain Pattern overlap:   Vitals:   03/13/23 0837 03/13/23 0842 03/13/23 0848 03/13/23 0854  BP: 129/86 Marland Kitchen)  154/111 (!) 132/94 122/67  Pulse:      Resp: 15 17 15  (!) 21  Temp:    97.7 F (36.5 C)  TempSrc:      SpO2: 99% 100% 100% 100%  Weight:      Height:         Start Time: 0827 hrs. End Time: 0847 hrs.  Imaging Guidance (Spinal):          Type of  Imaging Technique: Fluoroscopy Guidance (Spinal) Indication(s): Fluoroscopy guidance for needle placement to enhance accuracy in procedures requiring precise needle localization for targeted delivery of medication in or near specific anatomical locations not easily accessible without such real-time imaging assistance. Exposure Time: Please see nurses notes. Contrast: None used. Fluoroscopic Guidance: I was personally present during the use of fluoroscopy. "Tunnel Vision Technique" used to obtain the best possible view of the target area. Parallax error corrected before commencing the procedure. "Direction-depth-direction" technique used to introduce the needle under continuous pulsed fluoroscopy. Once target was reached, antero-posterior, oblique, and lateral fluoroscopic projection used confirm needle placement in all planes. Images permanently stored in EMR.    Interpretation: No contrast injected. I personally interpreted the imaging intraoperatively. Adequate needle placement confirmed in multiple planes. Permanent images saved into the patient's record.  Post-operative Assessment:  Post-procedure Vital Signs:  Pulse/HCG Rate: 7981 Temp: 97.7 F (36.5 C) Resp: (!) 21 BP: 122/67 SpO2: 100 %  EBL: None  Complications: No immediate post-treatment complications observed by team, or reported by patient.  Note: The patient tolerated the entire procedure well. A repeat set of vitals were taken after the procedure and the patient was kept under observation following institutional policy, for this type of procedure. Post-procedural neurological assessment was performed, showing return to baseline, prior to discharge. The patient was provided with post-procedure discharge instructions, including a section on how to identify potential problems. Should any problems arise concerning this procedure, the patient was given instructions to immediately contact us, at any time, without hesitation. In any  case, we plan to contact the patient by telephone for a follow-up status report regarding this interventional procedure.  Comments:  No additional relevant information.  Plan of Care (POC)  Orders:  Orders Placed This Encounter  Procedures   CERVICAL FACET (MEDIAL BRANCH NERVE BLOCK)     Scheduling Instructions:     Side: Bilateral     Level: C3-4, C4-5, C5-6, and TBD Facet joints (C3, C4, C5, C6, and TBD Medial Branch Nerves)     Sedation: Patient's choice.     Timeframe: Today    Where will this procedure be performed?:   [facility]   DG PA[facility] C-ARM 1-60 MIN NO REPORT    Intraoperative interpretation by procedural physician at [facility].    Standing Status:   Standing    Number of Occurrences:   1    Reason for exam::   Assistance in needle guidance and placement for procedures requiring needle placement in or near specific anatomical locations not easily accessible without such assistance.   Informed Consent Details: Physician/Practitioner Attestation; Transcribe to consent form and obtain patient signature    Note: Always confirm laterality of pain with Ms. Sturges, before procedure. Transcribe to consent form and obtain patient signature.    Physician/Practitioner attestation of informed consent for procedure/surgical case:   I, the physician/practitioner, attest that I have discussed with the patient the benefits, risks, side effects, alternatives, likelihood of achieving goals and potential problems during recovery for the  procedure that I have provided informed consent.    Procedure:   Bilateral Cervical facet block under fluoroscopic guidance.    Physician/Practitioner performing the procedure:   Reegan Mctighe A. Laban Emperor, MD    Indication/Reason:   Chronic neck pain secondary to cervical facet syndrome   Provide equipment / supplies at bedside    Procedure tray: "Block Tray" (Disposable  single use) Skin infiltration needle: Regular 1.5-in, 25-G,  (x1) Block Needle type: Spinal Amount/quantity: 5 Size: Regular (3.5-inch) Gauge: 22G    Standing Status:   Standing    Number of Occurrences:   1    Specify:   Block Tray   Saline lock IV    Have LR [phone] mL available and administer at 125 mL/hr if patient becomes hypotensive.    Standing Status:   Standing    Number of Occurrences:   1   Miscellanous precautions    Standing Status:   Standing    Number of Occurrences:   1   Chronic Opioid Analgesic:   None MME/day: 0 mg/day   Medications ordered for procedure: Meds ordered this encounter  Medications   lidocaine (XYLOCAINE) 2 % (with pres) injection 400 mg   pentafluoroprop-tetrafluoroeth (GEBAUERS) aerosol   midazolam (VERSED) 5 MG/5ML injection 0.5-2 mg    Make sure Flumazenil is available in the pyxis when using this medication. If oversedation occurs, administer 0.2 mg IV over 15 sec. If after 45 sec no response, administer 0.2 mg again over 1 min; may repeat at 1 min intervals; not to exceed 4 doses (1 mg)   fentaNYL (SUBLIMAZE) injection 25-50 mcg    Make sure Narcan is available in the pyxis when using this medication. In the event of respiratory depression (RR< 8/min): Titrate NARCAN (naloxone) in increments of 0.1 to 0.2 mg IV at 2-3 minute intervals, until desired degree of reversal.   ropivacaine (PF) 2 mg/mL (0.2%) (NAROPIN) injection 18 mL   dexamethasone (DECADRON) injection 20 mg   ondansetron (ZOFRAN) injection 4 mg   Medications administered: We administered lidocaine, pentafluoroprop-tetrafluoroeth, midazolam, fentaNYL, ropivacaine (PF) 2 mg/mL (0.2%), dexamethasone, and ondansetron.  See the medical record for exact dosing, route, and time of administration.  Follow-up plan:   Return in about 2 weeks (around 03/27/2023) for (Face2F), (PPE).       Interventional Therapies  Risk Factors  Considerations  Medical Comorbidities:  Allergy: CONTRAST (Anaphylactic-Type)  MO (BMI>35)  BA  GERD      Planned  Pending:   Diagnostic bilateral cervical facet MBB #1    Under consideration:   Diagnostic bilateral cervical facet MBB #1  Diagnostic bilateral lumbar facet MBB #1    Completed:   None at this time   Therapeutic  Palliative (PRN) options:   None established   Completed by other providers:   Physical therapy  Diagnostic bilateral lumbar facet blocks (no information as to technique or levels) Therapeutic lumbar facet RFA (pain physician in [place]) (poor results) (no access to notes)      Recent Visits Date Type Provider Dept  02/12/23 Office Visit Delano Metz, MD [facility]  01/20/23 Office Visit Delano Metz, MD [facility]  Showing recent visits within past 90 days and meeting all other requirements Today's Visits Date Type Provider Dept  03/13/23 Procedure visit Delano Metz, MD [facility]  Showing today's visits and meeting all other requirements Future Appointments Date Type Provider Dept  03/27/23 Appointment Delano Metz, MD [facility]  Showing future appointments within  next 90 days and meeting all other requirements  Disposition: Discharge home  Discharge (Date  Time): 03/13/2023;   hrs.   Primary Care Physician: Babs Bertin, MD Location: [facility] Note by: Oswaldo Done, MD (TTS technology used. I apologize for any typographical errors that were not detected and corrected.) Date: 03/13/2023; Time: 8:56 AM  Disclaimer:  Medicine is not an Visual merchandiser. The only guarantee in medicine is that nothing is guaranteed. It is important to note that the decision to proceed with this intervention was based on the information collected from the patient. The Data and conclusions were drawn from the patient's questionnaire, the interview, and the physical examination. Because the information was provided in large part by the patient, it cannot be  guaranteed that it has not been purposely or unconsciously manipulated. Every effort has been made to obtain as much relevant data as possible for this evaluation. It is important to note that the conclusions that lead to this procedure are derived in large part from the available data. Always take into account that the treatment will also be dependent on availability of resources and existing treatment guidelines, considered by other Pain Management Practitioners as being common knowledge and practice, at the time of the intervention. For Medico-Legal purposes, it is also important to point out that variation in procedural techniques and pharmacological choices are the acceptable norm. The indications, contraindications, technique, and results of the above procedure should only be interpreted and judged by a Board-Certified Interventional Pain Specialist with extensive familiarity and expertise in the same exact procedure and technique.

## 2023-03-14 ENCOUNTER — Telehealth: Payer: Self-pay | Admitting: *Deleted

## 2023-03-14 NOTE — Telephone Encounter (Signed)
Post procedure call;  patient reports that she is doing okay, a little difficulty last pm, took some tylenol.  A little better this morning.  Will continue to monitor, steroid rational reviewed.  Patient verbalizes u/o and will call if she needs anything, otherwise we will review at her f/up appt.

## 2023-03-25 ENCOUNTER — Ambulatory Visit: Payer: 59 | Attending: Pain Medicine | Admitting: Pain Medicine

## 2023-03-25 ENCOUNTER — Encounter: Payer: Self-pay | Admitting: Pain Medicine

## 2023-03-25 VITALS — BP 141/82 | HR 94 | Temp 98.0°F | Resp 18 | Ht 60.0 in | Wt 200.0 lb

## 2023-03-25 DIAGNOSIS — M542 Cervicalgia: Secondary | ICD-10-CM | POA: Diagnosis present

## 2023-03-25 DIAGNOSIS — R937 Abnormal findings on diagnostic imaging of other parts of musculoskeletal system: Secondary | ICD-10-CM

## 2023-03-25 DIAGNOSIS — M549 Dorsalgia, unspecified: Secondary | ICD-10-CM | POA: Diagnosis not present

## 2023-03-25 DIAGNOSIS — G8929 Other chronic pain: Secondary | ICD-10-CM

## 2023-03-25 DIAGNOSIS — G4486 Cervicogenic headache: Secondary | ICD-10-CM | POA: Diagnosis not present

## 2023-03-25 DIAGNOSIS — Z09 Encounter for follow-up examination after completed treatment for conditions other than malignant neoplasm: Secondary | ICD-10-CM | POA: Diagnosis present

## 2023-03-25 DIAGNOSIS — M47812 Spondylosis without myelopathy or radiculopathy, cervical region: Secondary | ICD-10-CM | POA: Diagnosis present

## 2023-03-25 DIAGNOSIS — M5030 Other cervical disc degeneration, unspecified cervical region: Secondary | ICD-10-CM | POA: Diagnosis not present

## 2023-03-25 NOTE — Progress Notes (Addendum)
PROVIDER NOTE: Information contained herein reflects review and annotations entered in association with encounter. Interpretation of such information and data should be left to medically-trained personnel. Information provided to patient can be located elsewhere in the medical record under "Patient Instructions". Document created using STT-dictation technology, any transcriptional errors that may result from process are unintentional.    Patient: Katelyn Haynes  Service Category: E/M  Provider: Oswaldo Done, MD  DOB: [birth year]  DOS: 03/25/2023  Referring Provider: Babs Bertin, MD  MRN: [id number]  Specialty: Interventional Pain Management  PCP: Babs Bertin, MD  Type: Established Patient  Setting: Ambulatory outpatient    Location: Office  Delivery: Face-to-face     HPI  Ms. Katelyn Haynes, a 48 y.o. year old female, is here today because of her Cervicalgia [M54.2]. Ms. Katelyn Haynes primary complain today is Neck Pain  Pertinent problems: Ms. Katelyn Haynes has Crushing injury of right middle finger; Chronic low back pain (2ry area of Pain) (Bilateral) (L>R) w/o sciatica; Cervicalgia; Pain in right hand; Chronic pain syndrome; Chronic neck and back pain (1ry area of Pain) (Bilateral); Cervicogenic headache (3ry area of Pain) (Bilateral); Lumbar facet joint pain; Chronic lower extremity pain (Non-dermatomal) (Left); Abnormal CT scan, cervical spine (02/18/2022); DDD (degenerative disc disease), cervical (C5-6); Spondylosis of lumbar region without myelopathy or radiculopathy; Lumbar facet joint syndrome; Cervical facet joint pain (Bilateral); Cervical facet syndrome (Bilateral); Spondylosis without myelopathy or radiculopathy, cervical region; and Chronic upper back pain (cervicogenic) (Bilateral) on their pertinent problem list. Pain Assessment: Severity of Chronic pain is reported as a 7 /10. Location: Neck  /denies. Onset: More than a month ago. Quality: Throbbing. Timing: Constant. Modifying factor(s): ice,  topical spray, Ibuprofen, Tylenol. Vitals:  height is 5' (1.524 m) and weight is 200 lb (90.7 kg). Her temporal temperature is 98 F (36.7 C). Her blood pressure is 141/82 (abnormal) and her pulse is 94. Her respiration is 18 and oxygen saturation is 100%.  BMI: Estimated body mass index is 39.06 kg/m as calculated from the following:   Height as of this encounter: 5' (1.524 m).   Weight as of this encounter: 200 lb (90.7 kg). Last encounter: 02/12/2023. Last procedure: 03/13/2023.  Reason for encounter: post-procedure evaluation and assessment.  Discussed the use of AI scribe software for clinical note transcription with the patient, who gave verbal consent to proceed.  History of Present Illness   The patient presents with persistent neck pain following a cervical facet block procedure.  She underwent a bilateral cervical facet block on January 16, targeting the C3-4, C4-5, and C5-6 levels. Immediately after the procedure, and for the next four to six hours while the area was numb, she did not experience her usual neck pain. However, two hours post-procedure, the pain returned and has persisted since then. She manages the pain with Tylenol, ice, and heat applications. The left side of her neck pain has worsened since the procedure. She describes pain in the middle of her neck when flexing her neck down and when looking up. Turning her head to the left causes pain in the center of her neck, while turning to the right causes pain on both sides of her neck. Tilting her head ear to shoulder on the right side results in neck pain, but no symptoms radiating into the arm. On the left side, she experiences more pain on the right side of her neck.  She experiences headaches originating from the back of her head, which have not improved since the procedure.  She has persistent upper back pain on both sides, which did not improve following the cervical facet block.  She reports shoulder pain, more  pronounced on the left side, which did not improve following the cervical facet block. The right shoulder also has some pain, but it is less severe.  No pain, numbness, or weakness in the upper extremities.      Post-procedure evaluation   Procedure: Cervical Facet Medial Branch Block(s) #1  Laterality: Bilateral  Level: C3, C4, C5, and C6 Medial Branch Level(s). Injecting these levels blocks the C3-4, C4-5, and C5-6 cervical facet joints.  Imaging: Fluoroscopic guidance Anesthesia: Local anesthesia (1-2% Lidocaine) Anxiolysis: IV Versed 4.0 mg Sedation: Moderate Sedation Fentanyl 2.0 mL (100 mcg) DOS: 03/13/2023  Performed by: Oswaldo Done, MD  Purpose: Diagnostic/Therapeutic Indications: Cervicalgia (cervical spine axial pain) severe enough to impact quality of life or function. 1. Cervicalgia   2. Cervical facet joint pain (Bilateral)   3. Cervical facet syndrome (Bilateral)   4. DDD (degenerative disc disease), cervical (C5-6)   5. Spondylosis without myelopathy or radiculopathy, cervical region   6. Abnormal CT scan, cervical spine (02/18/2022)   7. History of CONTRAST Allergy (Anaphylactic-Type)    NAS-11 Pain score:   Pre-procedure: 7 /10   Post-procedure: 0-No pain/10     Effectiveness:  Initial hour after procedure: 100 %. Subsequent 4-6 hours post-procedure: 0 %. Analgesia past initial 6 hours: 0 %. Ongoing improvement:  Analgesic: No benefit Function: No benefit ROM: No benefit  Pharmacotherapy Assessment  Analgesic: No chronic opioid analgesics therapy prescribed by our practice. None MME/day: 0 mg/day   Monitoring:  PMP: PDMP reviewed during this encounter.       Pharmacotherapy: No side-effects or adverse reactions reported. Compliance: No problems identified. Effectiveness: Clinically acceptable.  No notes on file  No results found for: "CBDTHCR" No results found for: "D8THCCBX" No results found for: "D9THCCBX"  UDS:  Summary  Date Value  Ref Range Status  01/20/2023 FINAL  Final    Comment:    ==================================================================== Compliance Drug Analysis, Ur ==================================================================== Test                             Result       Flag       Units  Drug Present not Declared for Prescription Verification   7-aminoclonazepam              222          UNEXPECTED ng/mg creat    7-aminoclonazepam is an expected metabolite of clonazepam. Source of    clonazepam is a scheduled prescription medication.    Naproxen                       PRESENT      UNEXPECTED  Drug Absent but Declared for Prescription Verification   Zolpidem                       Not Detected UNEXPECTED    Zolpidem, as indicated in the declared medication list, is not    always detected even when used as directed.  ==================================================================== Test                      Result    Flag   Units      Ref Range   Creatinine  114              mg/dL      >=81 ==================================================================== Declared Medications:  The flagging and interpretation on this report are based on the  following declared medications.  Unexpected results may arise from  inaccuracies in the declared medications.   **Note: The testing scope of this panel does not include small to  moderate amounts of these reported medications:   Zolpidem (Ambien)   **Note: The testing scope of this panel does not include the  following reported medications:   Albuterol (Ventolin HFA)  Amlodipine (Norvasc)  Buspirone (Buspar)  Esomeprazole (Nexium)  Nitroglycerin (Nitrostat)  Ondansetron (Zofran)  Vitamin B12 ==================================================================== For clinical consultation, please call [phone]. ====================================================================       ROS  Constitutional: Denies  any fever or chills Gastrointestinal: No reported hemesis, hematochezia, vomiting, or acute GI distress Musculoskeletal: Denies any acute onset joint swelling, redness, loss of ROM, or weakness Neurological: No reported episodes of acute onset apraxia, aphasia, dysarthria, agnosia, amnesia, paralysis, loss of coordination, or loss of consciousness  Medication Review  Vitamin D3, albuterol, busPIRone, clonazePAM, cyanocobalamin, ergocalciferol, esomeprazole, fluticasone, and zolpidem  History Review  Allergy: Ms. Pitkin is allergic to bactrim [sulfamethoxazole-trimethoprim], bee venom, compazine [prochlorperazine], contrast media [iodinated contrast media], meloxicam, seroquel [quetiapine], and tramadol. Drug: Ms. Mcaulay  has no history on file for drug use. Alcohol:  reports current alcohol use. Tobacco:  reports that she has been smoking. She has never used smokeless tobacco. Social: Ms. Broden  reports that she has been smoking. She has never used smokeless tobacco. She reports current alcohol use. Medical:  has a past medical history of Allergy, Arthritis, Chronic bronchitis (HCC), Depression, and GERD (gastroesophageal reflux disease). Surgical: Ms. Mazzoni  has a past surgical history that includes Cesarean section; Carpal tunnel release (Right); and Cardiac surgery. Family: family history is not on file.  Laboratory Chemistry Profile   Renal Lab Results  Component Value Date   BUN 10 01/20/2023   CREATININE 0.65 01/20/2023   BCR 15 01/20/2023   GFRAA >60 07/01/2019   GFRNONAA >60 08/26/2022    Hepatic Lab Results  Component Value Date   AST 18 01/20/2023   ALT 18 04/17/2022   ALBUMIN 4.4 01/20/2023   ALKPHOS 99 01/20/2023   LIPASE 28 04/17/2022    Electrolytes Lab Results  Component Value Date   NA 139 01/20/2023   K 4.0 01/20/2023   CL 103 01/20/2023   CALCIUM 9.2 01/20/2023   MG 1.9 01/20/2023    Bone Lab Results  Component Value Date   25OHVITD1 21 (L) 01/20/2023    25OHVITD2 <1.0 01/20/2023   25OHVITD3 21 01/20/2023    Inflammation (CRP: Acute Phase) (ESR: Chronic Phase) Lab Results  Component Value Date   CRP <1 01/20/2023   ESRSEDRATE 13 01/20/2023         Note: Above Lab results reviewed.  Recent Imaging Review  DG PAIN CLINIC C-ARM 1-60 MIN NO REPORT Fluoro was used, but no Radiologist interpretation will be provided.  Please refer to "NOTES" tab for provider progress note. Note: Reviewed        Physical Exam  General appearance: Well nourished, well developed, and well hydrated. In no apparent acute distress Mental status: Alert, oriented x 3 (person, place, & time)       Respiratory: No evidence of acute respiratory distress Eyes: PERLA Vitals: BP (!) 141/82   Pulse 94   Temp 98 F (36.7 C) (Temporal)  Resp 18   Ht 5' (1.524 m)   Wt 200 lb (90.7 kg)   SpO2 100%   BMI 39.06 kg/m  BMI: Estimated body mass index is 39.06 kg/m as calculated from the following:   Height as of this encounter: 5' (1.524 m).   Weight as of this encounter: 200 lb (90.7 kg). Ideal: Ideal body weight: 45.5 kg (100 lb 4.9 oz) Adjusted ideal body weight: 63.6 kg (140 lb 3 oz)  Physical Exam   NECK: Pain upon flexion and extension, localized to the midline during flexion and radiating upwards. Pain upon lateral rotation to both sides, centrally on left rotation and bilateral on right rotation. Pain on lateral flexion to both sides, more pronounced on right side during left lateral flexion. MUSCULOSKELETAL: Pain with shoulder shrugging. NEUROLOGICAL: No numbness or weakness in upper extremities.       Assessment   Diagnosis Status  1. Cervicalgia   2. Cervical facet joint pain (Bilateral)   3. Cervical facet syndrome (Bilateral)   4. Postop check   5. Chronic upper back pain (cervicogenic) (Bilateral)   6. Chronic neck and back pain (1ry area of Pain) (Bilateral)   7. Cervicogenic headache (3ry area of Pain) (Bilateral)   8. DDD (degenerative  disc disease), cervical (C5-6)   9. Abnormal CT scan, cervical spine (02/18/2022)    Persistent Unimproved Unimproved   Updated Problems: Problem  Severe Asthma (Resolved)    Plan of Care  Problem-specific:  Assessment and Plan    Cervical Facet Joint Pain   Bilateral cervical facet block at C3-4, C4-5, and C5-6 on January 16 with local anesthetic and steroid provided no significant relief, indicating the pain may not originate from the targeted facet joints. The lack of Katelyn Haynes relief suggests a mechanical issue rather than inflammation. Steroid benefits, if swelling is the cause, are expected in 4-10 days. Local anesthetics confirmed the pain origin with her short duration. Alternative treatments like radiofrequency ablation for mechanical pain were discussed. Order an MRI of the cervical spine. Review MRI results on MyChart and schedule a follow-up appointment to discuss findings.  Neck Pain   Persistent bilateral neck pain, worse on the left side, with associated headaches, upper back pain, and shoulder pain. Examination shows pain with neck flexion, extension, and rotation, indicating muscular involvement. Previous physical therapy was ineffective and worsened the condition. Discussed alternative treatments like cervical epidurals. Recommend over-the-counter supplements for pain and inflammation. Advise using ice and heat therapy as per post-procedure instructions.  Headaches   Persistent headaches originating from the occipital region may relate to cervical spine issues. Include in MRI assessment of the cervical spine. Consider further treatment options based on MRI findings.  Upper Back Pain   Persistent bilateral upper back pain may relate to cervical spine issues. Include in MRI assessment of the cervical spine. Consider further treatment options based on MRI findings.  Shoulder Pain   Persistent shoulder pain, more pronounced on the left side, may relate to cervical spine  issues. Include in MRI assessment of the cervical spine. Consider further treatment options based on MRI findings.  Follow-up   Schedule a follow-up appointment after MRI results are available.      Ms. Katelyn Haynes has a current medication list which includes the following Patman-term medication(s): albuterol, albuterol, albuterol, esomeprazole, fluticasone, and zolpidem.  Pharmacotherapy (Medications Ordered): No orders of the defined types were placed in this encounter.  Orders:  Orders Placed This Encounter  Procedures   CT CERVICAL SPINE  WO CONTRAST    Patient presents with axial pain with possible radicular component. Please assist [place] in identifying specific level(s) and laterality of any additional findings such as: 1. Facet (Zygapophyseal) joint DJD (Hypertrophy, space narrowing, subchondral sclerosis, and/or osteophyte formation) 2. DDD and/or IVDD (Loss of disc height, desiccation, gas patterns, osteophytes, endplate sclerosis, or "Black disc disease") 3. Pars defects 4. Spondylolisthesis, spondylosis, and/or spondyloarthropathies (include Degree/Grade of displacement in mm) (stability) 5. Vertebral body Fractures (acute/chronic) (state percentage of collapse) 6. Demineralization (osteopenia/osteoporotic) 7. Bone pathology 8. Foraminal narrowing  9. Surgical changes 10. Central, Lateral Recess, and/or Foraminal Stenosis (include AP diameter of stenosis in mm) 11. Surgical changes (hardware type, status, and presence of fibrosis) 12. Modic Type Changes (MRI only) 13. IVDD (Disc bulge, protrusion, herniation, extrusion) (Level, laterality, extent)  Medical necessity: Imaging study necessary to evaluate for possible degenerative disease responsible for neurologic dysfunction that may be caused by compression or inflammation of CNS and to help plan for interventional therapy or surgery, such as decompression of a pinched nerve or spinal fusion.    Standing Status:   Future     Expiration Date:   06/23/2023    Scheduling Instructions:     Please make sure that the patient understands that this needs to be done as soon as possible. Never have the patient do the imaging "just before the next appointment". Inform patient that having the imaging done within the [facility] will expedite the availability of the results and will provide      imaging availability to the requesting physician. In addition inform the patient that the imaging order has an expiration date and will not be renewed if not done within the active period.    Is patient pregnant?:   No    Preferred imaging location?:   [facility]    Call Results- Best Contact Number?:   [phone] [place]    Radiology Contrast Protocol - do NOT remove file path:   [email or web address]    Release to patient:   Immediate [1]   Nursing Instructions:    Please complete this patient's postprocedure evaluation.    Scheduling Instructions:     Please complete this patient's postprocedure evaluation.   Follow-up plan:   Return for Eval-day (M,W), (F2F), for review of ordered tests (Cervical CT).      Interventional Therapies  Risk Factors  Considerations  Medical Comorbidities:  Allergy: CONTRAST (Anaphylactic-Type)  MO (BMI>35)  BA  GERD     Planned  Pending:   Repeat CT of the cervical spine (last done 2023).  MRI not possible secondary to surgical clips.   Under consideration:   Diagnostic/therapeutic midline cervical ESI #1  Diagnostic bilateral lumbar facet MBB #1    Completed:   Diagnostic bilateral cervical facet MBB x1 (03/13/2023) (100/0/0/0) (postprocedure left-sided flareup)   Therapeutic  Palliative (PRN) options:   None established   Completed by other providers:   Physical therapy  Diagnostic bilateral lumbar facet blocks (no information as to technique or levels) Therapeutic lumbar facet RFA (pain  physician in [place]) (poor results) (no access to notes)      Recent Visits Date Type Provider Dept  03/13/23 Procedure visit Delano Metz, MD [facility]  02/12/23 Office Visit Delano Metz, MD [facility]  01/20/23 Office Visit Delano Metz, MD [facility]  Showing recent visits within past 90 days and meeting Haynes other requirements Today's Visits Date Type Provider Dept  03/25/23 Office Visit Delano Metz, MD [facility]  Showing today's visits and meeting Haynes other requirements Future Appointments No visits were found meeting these conditions. Showing future appointments within next 90 days and meeting Haynes other requirements  I discussed the assessment and treatment plan with the patient. The patient was provided an opportunity to ask questions and Haynes were answered. The patient agreed with the plan and demonstrated an understanding of the instructions.  Patient advised to call back or seek an in-person evaluation if the symptoms or condition worsens.  Duration of encounter: 39 minutes.  Total time on encounter, as per AMA guidelines included both the face-to-face and non-face-to-face time personally spent by the physician and/or other qualified health care professional(s) on the day of the encounter (includes time in activities that require the physician or other qualified health care professional and does not include time in activities normally performed by clinical staff). Physician's time may include the following activities when performed: Preparing to see the patient (e.g., pre-charting review of records, searching for previously ordered imaging, lab work, and nerve conduction tests) Review of prior analgesic pharmacotherapies. Reviewing PMP Interpreting ordered tests (e.g., lab work, imaging, nerve conduction tests) Performing post-procedure evaluations, including interpretation of diagnostic  procedures Obtaining and/or reviewing separately obtained history Performing a medically appropriate examination and/or evaluation Counseling and educating the patient/family/caregiver Ordering medications, tests, or procedures Referring and communicating with other health care professionals (when not separately reported) Documenting clinical information in the electronic or other health record Independently interpreting results (not separately reported) and communicating results to the patient/ family/caregiver Care coordination (not separately reported)  Note by: Oswaldo Done, MD Date: 03/25/2023; Time: 1:32 PM

## 2023-03-25 NOTE — Patient Instructions (Addendum)
______________________________________________________________________    OTC Supplements:   The following is a list of over-the-counter (OTC) supplements that have been found to have [employer]) studies suggesting that they may be of some benefits when used in moderation in some chronic pain-related conditions.  NOTE:  Always consult with your primary care provider and/or pharmacist before taking any OTC medications to make sure they will not interact with your current medications. Always use manufacturer's recommended dosage.  Supplement Possible benefit May be of benefit in treatment of   Turmeric/curcumin anti-inflammatory Joint and muscle aches and pain.  Glucosamine/chondroitin (triple strength) may slow loss of articular cartilage Joint pain.  Vitamin D-3* may suppress release of chemicals associated with inflammation. Increases tolerance to pain. Joint and muscle aches and pain.   Moringa(+) anti-inflammatory with mild analgesic effects Joint and muscle aches and pain.  Melatonin(+) Helps reset sleep cycle. Insomnia.  Vitamin B-12* may help keep nerves and blood cells healthy as well as maintaining function of nervous system Nerve pain (Burning pain)  Alpha-Lipoic-Acid (ALA)* antioxidant that may help with nerve health, pain, and blocking the activation of some inflammatory chemicals Diabetic neuropathy and metabolic syndrome  superoxide dismutase (SOD)** Currently being reviewed.   Tiger Balm Currently being reviewed.   hydrolyzed collagen peptides* Currently being reviewed.  Collagen supplementation may increases bone strength, density, and mass; may improve joint stiffness/mobility, and functionality; and may reduce joint pain. Possible chondroprotective effects. May help with protection of joint health.   Methylsulfonylmethane (MSM)* Currently being reviewed.   CBD(+) Currently being reviewed.   Delta-8 THC(+) Currently being reviewed.   *  Generally  Recognized As Safe (GRAS) approved substance.-FDA ([employer]) ** "Possibly Safe", but not considered Generally Recognized As Safe (Not GRAS) by the [place] (FDA) as a food additive. (+) Not considered Generally Recognized As Safe (Not GRAS) by the [employer] (FDA) as a food additive.  ______________________________________________________________________

## 2023-03-27 ENCOUNTER — Encounter: Payer: Self-pay | Admitting: Pain Medicine

## 2023-03-27 ENCOUNTER — Ambulatory Visit: Payer: 59 | Admitting: Pain Medicine

## 2023-03-31 ENCOUNTER — Inpatient Hospital Stay: Admission: RE | Admit: 2023-03-31 | Payer: 59 | Source: Ambulatory Visit

## 2023-04-04 ENCOUNTER — Other Ambulatory Visit: Payer: 59

## 2023-04-11 ENCOUNTER — Other Ambulatory Visit: Payer: 59

## 2023-04-16 ENCOUNTER — Other Ambulatory Visit: Payer: 59

## 2023-04-21 ENCOUNTER — Encounter: Payer: Self-pay | Admitting: Pain Medicine

## 2023-04-21 ENCOUNTER — Inpatient Hospital Stay: Admission: RE | Admit: 2023-04-21 | Payer: 59 | Source: Ambulatory Visit

## 2023-04-23 ENCOUNTER — Ambulatory Visit: Payer: 59

## 2023-04-28 ENCOUNTER — Inpatient Hospital Stay: Admission: RE | Admit: 2023-04-28 | Payer: 59 | Source: Ambulatory Visit

## 2023-05-28 ENCOUNTER — Other Ambulatory Visit: Payer: Self-pay | Admitting: Physician Assistant

## 2023-05-28 DIAGNOSIS — Z1231 Encounter for screening mammogram for malignant neoplasm of breast: Secondary | ICD-10-CM

## 2023-06-05 ENCOUNTER — Encounter
